# Patient Record
Sex: Female | Born: 1970
Health system: Southern US, Community
[De-identification: ages and names within clinical notes are randomized; demographics above are authoritative.]

## PROBLEM LIST (undated history)

## (undated) ENCOUNTER — Ambulatory Visit: Disposition: A | Discharge status: Home / Self Care | Payer: No Typology Code available for payment source

## (undated) DIAGNOSIS — I10 Essential (primary) hypertension: Secondary | ICD-10-CM

## (undated) DIAGNOSIS — E079 Disorder of thyroid, unspecified: Secondary | ICD-10-CM

## (undated) HISTORY — PX: KNEE ARTHROSCOPY: SUR90

## (undated) HISTORY — PX: MYOMECTOMY: SHX85

## (undated) HISTORY — PX: THYROIDECTOMY: SHX17

## (undated) HISTORY — PX: ENDOMETRIAL ABLATION W/ NOVASURE: SUR434

---

## 2002-08-15 DIAGNOSIS — Z5189 Encounter for other specified aftercare: Secondary | ICD-10-CM

## 2002-08-15 DIAGNOSIS — D649 Anemia, unspecified: Secondary | ICD-10-CM

## 2002-08-15 HISTORY — DX: Anemia, unspecified: D64.9

## 2002-08-15 HISTORY — DX: Encounter for other specified aftercare: Z51.89

## 2016-01-28 DIAGNOSIS — Z860101 Personal history of adenomatous and serrated colon polyps: Secondary | ICD-10-CM | POA: Insufficient documentation

## 2016-01-28 DIAGNOSIS — Z86010 Personal history of colonic polyps: Secondary | ICD-10-CM

## 2016-01-28 HISTORY — DX: Personal history of colonic polyps: Z86.010

## 2017-08-03 ENCOUNTER — Encounter: Payer: Self-pay | Admitting: Family Medicine

## 2017-08-03 DIAGNOSIS — Z86010 Personal history of colonic polyps: Secondary | ICD-10-CM | POA: Diagnosis not present

## 2017-08-03 DIAGNOSIS — Z800 Family history of malignant neoplasm of digestive organs: Secondary | ICD-10-CM | POA: Diagnosis not present

## 2017-08-03 DIAGNOSIS — Z01419 Encounter for gynecological examination (general) (routine) without abnormal findings: Secondary | ICD-10-CM | POA: Diagnosis not present

## 2017-08-03 DIAGNOSIS — Z803 Family history of malignant neoplasm of breast: Secondary | ICD-10-CM | POA: Diagnosis not present

## 2017-08-03 DIAGNOSIS — Z6841 Body Mass Index (BMI) 40.0 and over, adult: Secondary | ICD-10-CM | POA: Diagnosis not present

## 2017-08-14 DIAGNOSIS — J029 Acute pharyngitis, unspecified: Secondary | ICD-10-CM | POA: Diagnosis not present

## 2017-08-14 DIAGNOSIS — R350 Frequency of micturition: Secondary | ICD-10-CM | POA: Diagnosis not present

## 2017-09-18 DIAGNOSIS — Z809 Family history of malignant neoplasm, unspecified: Secondary | ICD-10-CM | POA: Diagnosis not present

## 2017-11-13 ENCOUNTER — Encounter: Payer: Self-pay | Admitting: Family Medicine

## 2017-11-13 DIAGNOSIS — Z9089 Acquired absence of other organs: Secondary | ICD-10-CM | POA: Diagnosis not present

## 2017-11-13 DIAGNOSIS — Z13220 Encounter for screening for lipoid disorders: Secondary | ICD-10-CM | POA: Diagnosis not present

## 2017-11-13 DIAGNOSIS — Z1321 Encounter for screening for nutritional disorder: Secondary | ICD-10-CM | POA: Diagnosis not present

## 2017-11-29 ENCOUNTER — Emergency Department (HOSPITAL_BASED_OUTPATIENT_CLINIC_OR_DEPARTMENT_OTHER): Payer: BLUE CROSS/BLUE SHIELD

## 2017-11-29 ENCOUNTER — Emergency Department (HOSPITAL_BASED_OUTPATIENT_CLINIC_OR_DEPARTMENT_OTHER)
Admission: EM | Admit: 2017-11-29 | Discharge: 2017-11-29 | Disposition: A | Discharge status: Home / Self Care | Payer: BLUE CROSS/BLUE SHIELD | Attending: Emergency Medicine | Admitting: Emergency Medicine

## 2017-11-29 ENCOUNTER — Other Ambulatory Visit: Payer: Self-pay

## 2017-11-29 ENCOUNTER — Encounter (HOSPITAL_BASED_OUTPATIENT_CLINIC_OR_DEPARTMENT_OTHER): Payer: Self-pay | Admitting: *Deleted

## 2017-11-29 DIAGNOSIS — Z79899 Other long term (current) drug therapy: Secondary | ICD-10-CM | POA: Insufficient documentation

## 2017-11-29 DIAGNOSIS — R079 Chest pain, unspecified: Secondary | ICD-10-CM | POA: Insufficient documentation

## 2017-11-29 DIAGNOSIS — I10 Essential (primary) hypertension: Secondary | ICD-10-CM | POA: Insufficient documentation

## 2017-11-29 DIAGNOSIS — E079 Disorder of thyroid, unspecified: Secondary | ICD-10-CM | POA: Insufficient documentation

## 2017-11-29 DIAGNOSIS — R0602 Shortness of breath: Secondary | ICD-10-CM | POA: Diagnosis not present

## 2017-11-29 DIAGNOSIS — R0789 Other chest pain: Secondary | ICD-10-CM | POA: Diagnosis not present

## 2017-11-29 HISTORY — DX: Disorder of thyroid, unspecified: E07.9

## 2017-11-29 HISTORY — DX: Essential (primary) hypertension: I10

## 2017-11-29 LAB — BASIC METABOLIC PANEL
Anion gap: 10 (ref 5–15)
BUN: 14 mg/dL (ref 6–20)
CALCIUM: 9.3 mg/dL (ref 8.9–10.3)
CHLORIDE: 103 mmol/L (ref 101–111)
CO2: 23 mmol/L (ref 22–32)
CREATININE: 0.9 mg/dL (ref 0.44–1.00)
GFR calc non Af Amer: >60 mL/min (ref >60)
Glucose, Bld: 87 mg/dL (ref 65–99)
Potassium: 3.8 mmol/L (ref 3.5–5.1)
SODIUM: 136 mmol/L (ref 135–145)

## 2017-11-29 LAB — CBC WITH DIFFERENTIAL/PLATELET
BASOS ABS: 0 10*3/uL (ref 0.0–0.1)
Basophils Relative: 0 %
EOS ABS: 0.1 10*3/uL (ref 0.0–0.7)
EOS PCT: 2 %
HCT: 33.6 % — ABNORMAL LOW (ref 36.0–46.0)
Hemoglobin: 11 g/dL — ABNORMAL LOW (ref 12.0–15.0)
LYMPHS PCT: 40 %
Lymphs Abs: 2.8 10*3/uL (ref 0.7–4.0)
MCH: 28.5 pg (ref 26.0–34.0)
MCHC: 32.7 g/dL (ref 30.0–36.0)
MCV: 87 fL (ref 78.0–100.0)
Monocytes Absolute: 0.6 10*3/uL (ref 0.1–1.0)
Monocytes Relative: 9 %
Neutro Abs: 3.4 10*3/uL (ref 1.7–7.7)
Neutrophils Relative %: 49 %
PLATELETS: 358 10*3/uL (ref 150–400)
RBC: 3.86 MIL/uL — AB (ref 3.87–5.11)
RDW: 13.2 % (ref 11.5–15.5)
WBC: 6.9 10*3/uL (ref 4.0–10.5)

## 2017-11-29 LAB — TROPONIN I: Troponin I: <0.03 ng/mL (ref <0.03)

## 2017-11-29 LAB — PREGNANCY, URINE: PREG TEST UR: NEGATIVE

## 2017-11-29 NOTE — ED Notes (Signed)
ED Provider at bedside. 

## 2017-11-29 NOTE — ED Triage Notes (Signed)
Pt sent here from UC for cp eval, c/o chest pain and right arm pain x 2 days

## 2017-11-29 NOTE — ED Provider Notes (Signed)
I received signout at the end of [facility], PA-C shift.  Patient here with complaints of chest pain.  Her workup has been unremarkable.  I was asked to follow-up on a delta troponin.  The troponin performed and it was normal.  Patient is resting comfortably.  She is currently stable for discharge.  She will follow-up with her primary care provider for further management.  Return precautions discussed.  She also received cardiology follow-up.  BP 136/68   Pulse 73   Temp (!) 97.4 F (36.3 C) (Oral)   Resp (!) 23   Ht 5\' 5"  (1.651 m)   Wt 113.4 kg (250 lb)   LMP 11/06/2017   SpO2 98%   BMI 41.60 kg/m   Results for orders placed or performed during the hospital encounter of 67/59/16  Basic metabolic panel  Result Value Ref Range   Sodium 136 135 - 145 mmol/L   Potassium 3.8 3.5 - 5.1 mmol/L   Chloride 103 101 - 111 mmol/L   CO2 23 22 - 32 mmol/L   Glucose, Bld 87 65 - 99 mg/dL   BUN 14 6 - 20 mg/dL   Creatinine, Ser 0.90 0.44 - 1.00 mg/dL   Calcium 9.3 8.9 - 10.3 mg/dL   GFR calc non Af Amer >60 >60 mL/min   GFR calc Af Amer >60 >60 mL/min   Anion gap 10 5 - 15  Pregnancy, urine  Result Value Ref Range   Preg Test, Ur NEGATIVE NEGATIVE  CBC with Differential  Result Value Ref Range   WBC 6.9 4.0 - 10.5 K/uL   RBC 3.86 (L) 3.87 - 5.11 MIL/uL   Hemoglobin 11.0 (L) 12.0 - 15.0 g/dL   HCT 33.6 (L) 36.0 - 46.0 %   MCV 87.0 78.0 - 100.0 fL   MCH 28.5 26.0 - 34.0 pg   MCHC 32.7 30.0 - 36.0 g/dL   RDW 13.2 11.5 - 15.5 %   Platelets 358 150 - 400 K/uL   Neutrophils Relative % 49 %   Neutro Abs 3.4 1.7 - 7.7 K/uL   Lymphocytes Relative 40 %   Lymphs Abs 2.8 0.7 - 4.0 K/uL   Monocytes Relative 9 %   Monocytes Absolute 0.6 0.1 - 1.0 K/uL   Eosinophils Relative 2 %   Eosinophils Absolute 0.1 0.0 - 0.7 K/uL   Basophils Relative 0 %   Basophils Absolute 0.0 0.0 - 0.1 K/uL  Troponin I  Result Value Ref Range   Troponin I <0.03 <0.03 ng/mL  Troponin I  Result Value Ref  Range   Troponin I <0.03 <0.03 ng/mL   Dg Chest 2 View  Result Date: 11/29/2017 CLINICAL DATA:  Sternal chest pain starting yesterday. EXAM: CHEST - 2 VIEW COMPARISON:  None. FINDINGS: The heart size and mediastinal contours are within normal limits. Both lungs are clear. The visualized skeletal structures are unremarkable. IMPRESSION: No active cardiopulmonary disease. Electronically Signed   By: Ashley Royalty M.D.   On: 11/29/2017 19:38      Domenic Moras, PA-C 11/29/17 2247    Davonna Belling, MD 11/30/17 [phone]

## 2017-11-29 NOTE — ED Provider Notes (Signed)
[name] EMERGENCY DEPARTMENT Provider Note   CSN: [id number] Arrival date & time: 11/29/17  1813     History   Chief Complaint Chief Complaint  Patient presents with  . Chest Pain    HPI Monica Spencer is a 47 y.o. female with a history of hypertension and hypothyroidism who presents to the emergency department from urgent care for complaint of chest pain that started yesterday afternoon.  Patient states that Monica Spencer was at school walking down the hallway when her right shoulder started to hurt, the pain seemed to radiate to her central chest.  Pain remained in this region.  Describes the discomfort as a pressure that did not have any associated symptoms with onset.  States that by the time Monica Spencer got home Monica Spencer was still having the discomfort, Monica Spencer noticed it got somewhat worse when Monica Spencer went up the stairs, Monica Spencer got anxious, and a bit short of breath but this resolved.  Monica Spencer tried taking a Goody powder without relief.  States Monica Spencer woke up this morning with continued discomfort.  Chest pressure has been constant since it started, it seems to be worse when Monica Spencer takes a big deep breath or when Monica Spencer is walking around, Monica Spencer does not notice it when Monica Spencer is sitting still, however does not feel it is necessary relieved by this.  Rates current discomfort a 3 out of 10 in severity, states this is improved from yesterday, Monica Spencer would not like treatment for this at this time.  Denies nausea, vomiting, lightheadedness, dizziness, syncope, leg pain/swelling, hemoptysis, recent surgery/trauma, recent long travel, hormone use, personal hx of cancer, hx of DVT/PE, or family hx of early CAD. Patient sent from urgent care where Monica Spencer received 324mg  of ASA.    HPI  Past Medical History:  Diagnosis Date  . Hypertension   . Thyroid disease     There are no active problems to display for this patient.   Past Surgical History:  Procedure Laterality Date  . THYROIDECTOMY       OB History   None       Home Medications    Prior to Admission medications   Medication Sig Start Date End Date Taking? Authorizing Provider  atenolol (TENORMIN) 25 MG tablet Take by mouth daily.   Yes [provider]  levothyroxine (SYNTHROID, LEVOTHROID) 100 MCG tablet Take 100 mcg by mouth daily before breakfast.   Yes [provider]    Family History History reviewed. No pertinent family history.  Social History Social History   Tobacco Use  . Smoking status: Never Smoker  . Smokeless tobacco: Never Used  Substance Use Topics  . Alcohol use: Never    Frequency: Never  . Drug use: Never     Allergies   Patient has no known allergies.   Review of Systems Review of Systems  Constitutional: Negative for chills and fever.  Respiratory: Positive for shortness of breath (briefly yesterday, resolved). Negative for cough and wheezing.   Cardiovascular: Positive for chest pain. Negative for palpitations and leg swelling.  Gastrointestinal: Negative for abdominal pain, constipation, diarrhea, nausea and vomiting.  Neurological: Negative for dizziness, syncope and light-headedness.  Psychiatric/Behavioral: The patient is nervous/anxious (briefly yesterday, resolved).   All other systems reviewed and are negative.    Physical Exam Updated Vital Signs BP 138/82 (BP Location: Left Arm)   Pulse 75   Temp (!) 97.4 F (36.3 C) (Oral)   Resp 16   Ht 5\' 5"  (1.651 m)   Wt 113.4  kg (250 lb)   LMP 11/06/2017   SpO2 98%   BMI 41.60 kg/m   Physical Exam  Constitutional: Monica Spencer appears well-developed and well-nourished. No distress.  HENT:  Head: Normocephalic and atraumatic.  Eyes: Conjunctivae are normal. Right eye exhibits no discharge. Left eye exhibits no discharge.  Cardiovascular: Normal rate and regular rhythm.  No murmur heard. Pulses:      Radial pulses are 2+ on the right side, and 2+ on the left side.       Dorsalis pedis pulses are 2+ on the right side, and 2+ on  the left side.  Pulmonary/Chest: Effort normal and breath sounds normal. No respiratory distress. Monica Spencer has no decreased breath sounds. Monica Spencer has no wheezes. Monica Spencer has no rales. Monica Spencer exhibits no tenderness, no crepitus, no edema, no deformity and no swelling.  Abdominal: Soft. Monica Spencer exhibits no distension. There is no tenderness.  Musculoskeletal:       Right lower leg: Monica Spencer exhibits no tenderness and no edema.       Left lower leg: Monica Spencer exhibits no tenderness and no edema.  Neurological: Monica Spencer is alert.  Clear speech.   Skin: Skin is warm and dry. No rash noted.  Psychiatric: Monica Spencer has a normal mood and affect. Her behavior is normal.  Nursing note and vitals reviewed.   ED Treatments / Results  Labs Results for orders placed or performed during the hospital encounter of 54/65/03  Basic metabolic panel  Result Value Ref Range   Sodium 136 135 - 145 mmol/L   Potassium 3.8 3.5 - 5.1 mmol/L   Chloride 103 101 - 111 mmol/L   CO2 23 22 - 32 mmol/L   Glucose, Bld 87 65 - 99 mg/dL   BUN 14 6 - 20 mg/dL   Creatinine, Ser 0.90 0.44 - 1.00 mg/dL   Calcium 9.3 8.9 - 10.3 mg/dL   GFR calc non Af Amer >60 >60 mL/min   GFR calc Af Amer >60 >60 mL/min   Anion gap 10 5 - 15  Pregnancy, urine  Result Value Ref Range   Preg Test, Ur NEGATIVE NEGATIVE  CBC with Differential  Result Value Ref Range   WBC 6.9 4.0 - 10.5 K/uL   RBC 3.86 (L) 3.87 - 5.11 MIL/uL   Hemoglobin 11.0 (L) 12.0 - 15.0 g/dL   HCT 33.6 (L) 36.0 - 46.0 %   MCV 87.0 78.0 - 100.0 fL   MCH 28.5 26.0 - 34.0 pg   MCHC 32.7 30.0 - 36.0 g/dL   RDW 13.2 11.5 - 15.5 %   Platelets 358 150 - 400 K/uL   Neutrophils Relative % 49 %   Neutro Abs 3.4 1.7 - 7.7 K/uL   Lymphocytes Relative 40 %   Lymphs Abs 2.8 0.7 - 4.0 K/uL   Monocytes Relative 9 %   Monocytes Absolute 0.6 0.1 - 1.0 K/uL   Eosinophils Relative 2 %   Eosinophils Absolute 0.1 0.0 - 0.7 K/uL   Basophils Relative 0 %   Basophils Absolute 0.0 0.0 - 0.1 K/uL  Troponin I  Result  Value Ref Range   Troponin I <0.03 <0.03 ng/mL    EKG EKG Interpretation  Date/Time:  Wednesday November 29 2017 18:28:13 EDT Ventricular Rate:  74 PR Interval:    QRS Duration: 90 QT Interval:  410 QTC Calculation: 455 R Axis:   39 Text Interpretation:  Sinus rhythm Confirmed by Davonna Belling [phone]) on 11/29/2017 6:36:42 PM   Radiology Dg Chest 2 View  Result  Date: 11/29/2017 CLINICAL DATA:  Sternal chest pain starting yesterday. EXAM: CHEST - 2 VIEW COMPARISON:  None. FINDINGS: The heart size and mediastinal contours are within normal limits. Both lungs are clear. The visualized skeletal structures are unremarkable. IMPRESSION: No active cardiopulmonary disease. Electronically Signed   By: Ashley Royalty M.D.   On: 11/29/2017 19:38    Procedures Procedures (including critical care time)  Medications Ordered in ED Medications - No data to display   Initial Impression / Assessment and Plan / ED Course  I have reviewed the triage vital signs and the nursing notes.  Pertinent labs & imaging results that were available during my care of the patient were reviewed by me and considered in my medical decision making (see chart for details).   Patient presents with complaint of chest pain. Monica Spencer is nontoxic appearing, in no apparent distress, vitals without significant abnormality. Patient has benign physical exam. Will initiate chest pain work-up with labs, EKG, and CXR.   Labs reviewed and grossly unremarkable. Patient with hgb of 11.0- this is improved from last on record with chart review of 10.0 in 2011. No leucocytosis. No significant electrolyte abnormalities. Troponin WNL. CXR negative for acute abnormality. Patient is PERC negative therefore doubt pulmonary embolism. Her pain is not a tearing sensation, there is no widening of the mediastinum on CXR, radial pulses are 2+ and symmetric therefore doubt dissection. Patient with Heart Path Score of 3- given middle pressure/heaviness  that is somewhat worsened with exertion, cardiac risk factors include hypertensive and obesity, and EKG shows NSR without obvious ischemia. With presentation and Heart Path score of 3 will plan for delta troponin- if negative plan for discharge home with cardiology follow up. Findings and plan of care discussed with supervising physician Dr. Alvino Chapel who is in agreement with plan. All results thus far have been discussed with the patient who is resting comfortably on my re-evaluation.    Patient signed out at change of shift to Domenic Moras PA-C pending delta troponin- if negative plan for DC home with cardiology follow up.   Final Clinical Impressions(s) / ED Diagnoses   Final diagnoses:  Chest pain, unspecified type    ED Discharge Orders    None       Leafy Kindle 11/29/17 2107    Davonna Belling, MD 11/30/17 [phone]

## 2017-11-29 NOTE — Discharge Instructions (Addendum)
You were seen in the emergency department today for chest pain. Your EKG was reassuring. The enzyme we use to check your heart was normal. Your labs did not show any problems with your electrolytes or kidneys. Your hemoglobin was a bit low at 11.0- this is similar to previous blood work you have had done. Your chest x-ray was normal.   We would like you to follow up with a cardiologist in the next 3-5 days for re-evaluation of your symptoms and possible further work-up. With this being said please return to the ER at any time for any new or worsening symptoms including but not limited to worsening pain, shortness of breath, nausea, vomiting, passing out, or any other concerns you may have.

## 2017-12-04 ENCOUNTER — Ambulatory Visit: Payer: BLUE CROSS/BLUE SHIELD | Admitting: Family Medicine

## 2017-12-04 ENCOUNTER — Encounter: Payer: Self-pay | Admitting: Family Medicine

## 2017-12-04 VITALS — BP 128/80 | HR 69 | Temp 98.3°F | Ht 65.5 in | Wt 252.9 lb

## 2017-12-04 DIAGNOSIS — Z131 Encounter for screening for diabetes mellitus: Secondary | ICD-10-CM | POA: Diagnosis not present

## 2017-12-04 DIAGNOSIS — I10 Essential (primary) hypertension: Secondary | ICD-10-CM | POA: Diagnosis not present

## 2017-12-04 DIAGNOSIS — E038 Other specified hypothyroidism: Secondary | ICD-10-CM

## 2017-12-04 DIAGNOSIS — Z13220 Encounter for screening for lipoid disorders: Secondary | ICD-10-CM

## 2017-12-04 DIAGNOSIS — Z0000 Encounter for general adult medical examination without abnormal findings: Secondary | ICD-10-CM

## 2017-12-04 NOTE — Progress Notes (Signed)
Patient presents to clinic today for CPE and to establish care.  SUBJECTIVE: PMH: Pt is a 47 yo female with pmh sig for HTN, hypothyroidism, h/o polyps in colon.  Pt was previously seen in [place], [place].  Pt has a goal to loose 40-50 lbs before her 48th birthday next yr.  HTN: -pt taking atenolol 50 mg daily -not currently checking bp at home -endorses not eating as well as she normally does.  Pt has been living between here and [place].  Endorses cooking less. -pt drinking water daily  Hypothyroidism: -pt has a h/o goiter s/p partial resection of thyroid -currently taking levothyroxine 100 mcg daily -denies hair loss, palpitations, heat or cold intolerance, constipation or diarrhea  Allergies: NKDA -pt does mention morphine after surgery makes her itch    Past surgical history: Myomectomy 2004 and 2009 for history of fibroids which caused anemia Partial thyroidectomy 1999 Breast biopsy, left 2009 Removal of extra bone and left knee 1989 Uterine artery embolization 2017  Social history: Patient is married.  She has an adopted daughter.  She currently works as a Programmer, multimedia.  She has a Oceanographer in [employer].  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  Patient denies alcohol, tobacco, drug use.  Family medical history: Mom-Deceased, colon cancer, diabetes, HTN Dad-deceased, colon cancer Sister-Diedre, alive Brother-Wayne, alive, colon cancer [employer]-deceased, breast cancer, DM   Health Maintenance: Vision --Dr. Myrene Buddy Immunizations --influenza vaccine 2017 Colonoscopy --2017.  Due every 3 years given family history and polyps Mammogram --2018 PAP --December 2018 LMP--12/03/17  Past Medical History:  Diagnosis Date  . Hypertension   . Thyroid disease     Past Surgical History:  Procedure Laterality Date  . THYROIDECTOMY      Current Outpatient Medications on File Prior to Visit  Medication Sig Dispense Refill  . atenolol (TENORMIN) 25  MG tablet Take by mouth daily.    Marland Kitchen levothyroxine (SYNTHROID, LEVOTHROID) 100 MCG tablet Take 100 mcg by mouth daily before breakfast.     No current facility-administered medications on file prior to visit.     No Known Allergies  History reviewed. No pertinent family history.  Social History   Socioeconomic History  . Marital status: Married    Spouse name: Not on file  . Number of children: Not on file  . Years of education: Not on file  . Highest education level: Not on file  Occupational History  . Not on file  Social Needs  . Financial resource strain: Not on file  . Food insecurity:    Worry: Not on file    Inability: Not on file  . Transportation needs:    Medical: Not on file    Non-medical: Not on file  Tobacco Use  . Smoking status: Never Smoker  . Smokeless tobacco: Never Used  Substance and Sexual Activity  . Alcohol use: Never    Frequency: Never  . Drug use: Never  . Sexual activity: Not on file  Lifestyle  . Physical activity:    Days per week: Not on file    Minutes per session: Not on file  . Stress: Not on file  Relationships  . Social connections:    Talks on phone: Not on file    Gets together: Not on file    Attends religious service: Not on file    Active member of club or organization: Not on file    Attends meetings of clubs or organizations: Not on file  Relationship status: Not on file  . Intimate partner violence:    Fear of current or ex partner: Not on file    Emotionally abused: Not on file    Physically abused: Not on file    Forced sexual activity: Not on file  Other Topics Concern  . Not on file  Social History Narrative  . Not on file    ROS General: Denies fever, chills, night sweats, changes in weight, changes in appetite HEENT: Denies headaches, ear pain, changes in vision, rhinorrhea, sore throat CV: Denies CP, palpitations, SOB, orthopnea Pulm: Denies SOB, cough, wheezing GI: Denies abdominal pain, nausea,  vomiting, diarrhea, constipation GU: Denies dysuria, hematuria, frequency, vaginal discharge Msk: Denies muscle cramps, joint pains Neuro: Denies weakness, numbness, tingling Skin: Denies rashes, bruising Psych: Denies depression, anxiety, hallucinations  BP 128/80 (BP Location: Left Arm, Patient Position: Sitting, Cuff Size: Large)   Pulse 69   Temp 98.3 F (36.8 C) (Oral)   Ht 5' 5.5" (1.664 m)   Wt 252 lb 14.4 oz (114.7 kg)   LMP 11/06/2017   SpO2 98%   BMI 41.44 kg/m   Physical Exam  Gen. Pleasant, well developed, well-nourished, in NAD HEENT - [place]/AT, PERRL, no scleral icterus, no nasal drainage, pharynx without erythema or exudate.  TMs normal bilaterally.  No cervical lymphadenopathy. Neck: No JVD, no thyromegaly, no carotid bruits Lungs: no use of accessory muscles, CTAB, no wheezes, rales or rhonchi Cardiovascular: RRR, No r/g/m, no peripheral edema Abdomen: BS present, soft, nontender, nondistended, no hepatosplenomegaly Musculoskeletal: No deformities, moves all four extremities, no cyanosis or clubbing, normal tone Neuro:  A&Ox3, CN II-XII intact, normal gait Skin:  Warm, dry, intact, no lesions Psych: normal affect, mood appropriate  Recent Results (from the past 2160 hour(s))  Basic metabolic panel     Status: None   Collection Time: 11/29/17  6:48 PM  Result Value Ref Range   Sodium 136 135 - 145 mmol/L   Potassium 3.8 3.5 - 5.1 mmol/L   Chloride 103 101 - 111 mmol/L   CO2 23 22 - 32 mmol/L   Glucose, Bld 87 65 - 99 mg/dL   BUN 14 6 - 20 mg/dL   Creatinine, Ser 0.90 0.44 - 1.00 mg/dL   Calcium 9.3 8.9 - 10.3 mg/dL   GFR calc non Af Amer >60 >60 mL/min   GFR calc Af Amer >60 >60 mL/min    Comment: (NOTE) The eGFR has been calculated using the CKD EPI equation. This calculation has not been validated in all clinical situations. eGFR's persistently <60 mL/min signify possible Chronic Kidney Disease.    Anion gap 10 5 - 15    Comment: Performed at [facility], [address] [place], [place] [address]  Pregnancy, urine     Status: None   Collection Time: 11/29/17  6:48 PM  Result Value Ref Range   Preg Test, Ur NEGATIVE NEGATIVE    Comment:        THE SENSITIVITY OF THIS METHODOLOGY IS >20 mIU/mL. Performed at [facility], [address] [place], [place] [address]   CBC with Differential     Status: Abnormal   Collection Time: 11/29/17  6:48 PM  Result Value Ref Range   WBC 6.9 4.0 - 10.5 K/uL   RBC 3.86 (L) 3.87 - 5.11 MIL/uL   Hemoglobin 11.0 (L) 12.0 - 15.0 g/dL   HCT 33.6 (L) 36.0 - 46.0 %   MCV 87.0 78.0 -  100.0 fL   MCH 28.5 26.0 - 34.0 pg   MCHC 32.7 30.0 - 36.0 g/dL   RDW 13.2 11.5 - 15.5 %   Platelets 358 150 - 400 K/uL   Neutrophils Relative % 49 %   Neutro Abs 3.4 1.7 - 7.7 K/uL   Lymphocytes Relative 40 %   Lymphs Abs 2.8 0.7 - 4.0 K/uL   Monocytes Relative 9 %   Monocytes Absolute 0.6 0.1 - 1.0 K/uL   Eosinophils Relative 2 %   Eosinophils Absolute 0.1 0.0 - 0.7 K/uL   Basophils Relative 0 %   Basophils Absolute 0.0 0.0 - 0.1 K/uL    Comment: Performed at [facility], [address] [place], [place] [address]  Troponin I     Status: None   Collection Time: 11/29/17  6:48 PM  Result Value Ref Range   Troponin I <0.03 <0.03 ng/mL    Comment: Performed at [facility], [address] [place], [place] [address]  Troponin I     Status: None   Collection Time: 11/29/17  9:36 PM  Result Value Ref Range   Troponin I <0.03 <0.03 ng/mL    Comment: Performed at [facility], [address] [place], [place] [address]    Assessment/Plan: Well adult exam  -Anticipatory guidance given including wearing seatbelts, smoke detectors in the home, increasing physical activity, increasing p.o. intake of water and vegetables. -Pap up-to-date, done by OB/GYN -Mammogram up-to-date -Next CPE in 1 year - Plan: CBC with Differential/Platelet  Essential  hypertension -Continue atenolol 50 mg daily  - Plan: Basic metabolic panel  Other specified hypothyroidism -s/p partial thyroidectomy -Continue levothyroxine 100 mcg daily - Plan: TSH  Screening for diabetes mellitus  - Plan: Hemoglobin A1c  Screening for cholesterol level  - Plan: Lipid panel  F/u prn  Grier Mitts, MD

## 2017-12-04 NOTE — Patient Instructions (Addendum)
Preventive Care 40-64 Years, Female Preventive care refers to lifestyle choices and visits with your health care provider that can promote health and wellness. What does preventive care include?  A yearly physical exam. This is also called an annual well check.  Dental exams once or twice a year.  Routine eye exams. Ask your health care provider how often you should have your eyes checked.  Personal lifestyle choices, including: ? Daily care of your teeth and gums. ? Regular physical activity. ? Eating a healthy diet. ? Avoiding tobacco and drug use. ? Limiting alcohol use. ? Practicing safe sex. ? Taking low-dose aspirin daily starting at age 50. ? Taking vitamin and mineral supplements as recommended by your health care provider. What happens during an annual well check? The services and screenings done by your health care provider during your annual well check will depend on your age, overall health, lifestyle risk factors, and family history of disease. Counseling Your health care provider may ask you questions about your:  Alcohol use.  Tobacco use.  Drug use.  Emotional well-being.  Home and relationship well-being.  Sexual activity.  Eating habits.  Work and work environment.  Method of birth control.  Menstrual cycle.  Pregnancy history.  Screening You may have the following tests or measurements:  Height, weight, and BMI.  Blood pressure.  Lipid and cholesterol levels. These may be checked every 5 years, or more frequently if you are over 50 years old.  Skin check.  Lung cancer screening. You may have this screening every year starting at age 55 if you have a 30-pack-year history of smoking and currently smoke or have quit within the past 15 years.  Fecal occult blood test (FOBT) of the stool. You may have this test every year starting at age 50.  Flexible sigmoidoscopy or colonoscopy. You may have a sigmoidoscopy every 5 years or a colonoscopy  every 10 years starting at age 50.  Hepatitis C blood test.  Hepatitis B blood test.  Sexually transmitted disease (STD) testing.  Diabetes screening. This is done by checking your blood sugar (glucose) after you have not eaten for a while (fasting). You may have this done every 1-3 years.  Mammogram. This may be done every 1-2 years. Talk to your health care provider about when you should start having regular mammograms. This may depend on whether you have a family history of breast cancer.  BRCA-related cancer screening. This may be done if you have a family history of breast, ovarian, tubal, or peritoneal cancers.  Pelvic exam and Pap test. This may be done every 3 years starting at age 21. Starting at age 30, this may be done every 5 years if you have a Pap test in combination with an HPV test.  Bone density scan. This is done to screen for osteoporosis. You may have this scan if you are at high risk for osteoporosis.  Discuss your test results, treatment options, and if necessary, the need for more tests with your health care provider. Vaccines Your health care provider may recommend certain vaccines, such as:  Influenza vaccine. This is recommended every year.  Tetanus, diphtheria, and acellular pertussis (Tdap, Td) vaccine. You may need a Td booster every 10 years.  Varicella vaccine. You may need this if you have not been vaccinated.  Zoster vaccine. You may need this after age 60.  Measles, mumps, and rubella (MMR) vaccine. You may need at least one dose of MMR if you were born in   in 1957 or later. You may also need a second dose.  Pneumococcal 13-valent conjugate (PCV13) vaccine. You may need this if you have certain conditions and were not previously vaccinated.  Pneumococcal polysaccharide (PPSV23) vaccine. You may need one or two doses if you smoke cigarettes or if you have certain conditions.  Meningococcal vaccine. You may need this if you have certain  conditions.  Hepatitis A vaccine. You may need this if you have certain conditions or if you travel or work in places where you may be exposed to hepatitis A.  Hepatitis B vaccine. You may need this if you have certain conditions or if you travel or work in places where you may be exposed to hepatitis B.  Haemophilus influenzae type b (Hib) vaccine. You may need this if you have certain conditions.  Talk to your health care provider about which screenings and vaccines you need and how often you need them. This information is not intended to replace advice given to you by your health care provider. Make sure you discuss any questions you have with your health care provider. Document Released: 08/28/2015 Document Revised: 04/20/2016 Document Reviewed: 06/02/2015 Elsevier Interactive Patient Education  2018 [place] Eating Plan DASH stands for "Dietary Approaches to Stop Hypertension." The DASH eating plan is a healthy eating plan that has been shown to reduce high blood pressure (hypertension). It may also reduce your risk for type 2 diabetes, heart disease, and stroke. The DASH eating plan may also help with weight loss. What are tips for following this plan? General guidelines  Avoid eating more than 2,300 mg (milligrams) of salt (sodium) a day. If you have hypertension, you may need to reduce your sodium intake to 1,500 mg a day.  Limit alcohol intake to no more than 1 drink a day for nonpregnant women and 2 drinks a day for men. One drink equals 12 oz of beer, 5 oz of wine, or 1 oz of hard liquor.  Work with your health care provider to maintain a healthy body weight or to lose weight. Ask what an ideal weight is for you.  Get at least 30 minutes of exercise that causes your heart to beat faster (aerobic exercise) most days of the week. Activities may include walking, swimming, or biking.  Work with your health care provider or diet and nutrition specialist (dietitian) to  adjust your eating plan to your individual calorie needs. Reading food labels  Check food labels for the amount of sodium per serving. Choose foods with less than 5 percent of the Daily Value of sodium. Generally, foods with less than 300 mg of sodium per serving fit into this eating plan.  To find whole grains, look for the word "whole" as the first word in the ingredient list. Shopping  Buy products labeled as "low-sodium" or "no salt added."  Buy fresh foods. Avoid canned foods and premade or frozen meals. Cooking  Avoid adding salt when cooking. Use salt-free seasonings or herbs instead of table salt or sea salt. Check with your health care provider or pharmacist before using salt substitutes.  Do not fry foods. Cook foods using healthy methods such as baking, boiling, grilling, and broiling instead.  Cook with heart-healthy oils, such as olive, canola, soybean, or sunflower oil. Meal planning   Eat a balanced diet that includes: ? 5 or more servings of fruits and vegetables each day. At each meal, try to fill half of your plate with fruits and vegetables. ? Up  to 6-8 servings of whole grains each day. ? Less than 6 oz of lean meat, poultry, or fish each day. A 3-oz serving of meat is about the same size as a deck of cards. One egg equals 1 oz. ? 2 servings of low-fat dairy each day. ? A serving of nuts, seeds, or beans 5 times each week. ? Heart-healthy fats. Healthy fats called Omega-3 fatty acids are found in foods such as flaxseeds and coldwater fish, like sardines, salmon, and mackerel.  Limit how much you eat of the following: ? Canned or prepackaged foods. ? Food that is high in trans fat, such as fried foods. ? Food that is high in saturated fat, such as fatty meat. ? Sweets, desserts, sugary drinks, and other foods with added sugar. ? Full-fat dairy products.  Do not salt foods before eating.  Try to eat at least 2 vegetarian meals each week.  Eat more home-cooked  food and less restaurant, buffet, and fast food.  When eating at a restaurant, ask that your food be prepared with less salt or no salt, if possible. What foods are recommended? The items listed may not be a complete list. Talk with your dietitian about what dietary choices are best for you. Grains Whole-grain or whole-wheat bread. Whole-grain or whole-wheat pasta. Brown rice. [place]. Bulgur. Whole-grain and low-sodium cereals. Pita bread. Low-fat, low-sodium crackers. Whole-wheat flour tortillas. Vegetables Fresh or frozen vegetables (raw, steamed, roasted, or grilled). Low-sodium or reduced-sodium tomato and vegetable juice. Low-sodium or reduced-sodium tomato sauce and tomato paste. Low-sodium or reduced-sodium canned vegetables. Fruits All fresh, dried, or frozen fruit. Canned fruit in natural juice (without added sugar). Meat and other protein foods Skinless chicken or [place]. Ground chicken or [place]. Pork with fat trimmed off. Fish and seafood. Egg whites. Dried beans, peas, or lentils. Unsalted nuts, nut butters, and seeds. Unsalted canned beans. Lean cuts of beef with fat trimmed off. Low-sodium, lean deli meat. Dairy Low-fat (1%) or fat-free (skim) milk. Fat-free, low-fat, or reduced-fat cheeses. Nonfat, low-sodium ricotta or cottage cheese. Low-fat or nonfat yogurt. Low-fat, low-sodium cheese. Fats and oils Soft margarine without trans fats. Vegetable oil. Low-fat, reduced-fat, or light mayonnaise and salad dressings (reduced-sodium). Canola, safflower, olive, soybean, and sunflower oils. Avocado. Seasoning and other foods Herbs. Spices. Seasoning mixes without salt. Unsalted popcorn and pretzels. Fat-free sweets. What foods are not recommended? The items listed may not be a complete list. Talk with your dietitian about what dietary choices are best for you. Grains Baked goods made with fat, such as croissants, muffins, or some breads. Dry pasta or rice meal  packs. Vegetables Creamed or fried vegetables. Vegetables in a cheese sauce. Regular canned vegetables (not low-sodium or reduced-sodium). Regular canned tomato sauce and paste (not low-sodium or reduced-sodium). Regular tomato and vegetable juice (not low-sodium or reduced-sodium). Angie Fava. Olives. Fruits Canned fruit in a light or heavy syrup. Fried fruit. Fruit in cream or butter sauce. Meat and other protein foods Fatty cuts of meat. Ribs. Fried meat. Berniece Salines. Sausage. Bologna and other processed lunch meats. Salami. Fatback. Hotdogs. Bratwurst. Salted nuts and seeds. Canned beans with added salt. Canned or smoked fish. Whole eggs or egg yolks. Chicken or [place] with skin. Dairy Whole or 2% milk, cream, and half-and-half. Whole or full-fat cream cheese. Whole-fat or sweetened yogurt. Full-fat cheese. Nondairy creamers. Whipped toppings. Processed cheese and cheese spreads. Fats and oils Butter. Stick margarine. Lard. Shortening. Ghee. Bacon fat. Tropical oils, such as coconut, palm kernel, or palm oil. Seasoning  and other foods Salted popcorn and pretzels. Onion salt, garlic salt, seasoned salt, table salt, and sea salt. Worcestershire sauce. Tartar sauce. Barbecue sauce. Teriyaki sauce. Soy sauce, including reduced-sodium. Steak sauce. Canned and packaged gravies. Fish sauce. Oyster sauce. Cocktail sauce. Horseradish that you find on the shelf. Ketchup. Mustard. Meat flavorings and tenderizers. Bouillon cubes. Hot sauce and Tabasco sauce. Premade or packaged marinades. Premade or packaged taco seasonings. Relishes. Regular salad dressings. Where to find more information:  National Heart, Lung, and [place]: https://wilson-eaton.com/  American Heart Association: www.heart.org Summary  The DASH eating plan is a healthy eating plan that has been shown to reduce high blood pressure (hypertension). It may also reduce your risk for type 2 diabetes, heart disease, and stroke.  With the DASH eating  plan, you should limit salt (sodium) intake to 2,300 mg a day. If you have hypertension, you may need to reduce your sodium intake to 1,500 mg a day.  When on the DASH eating plan, aim to eat more fresh fruits and vegetables, whole grains, lean proteins, low-fat dairy, and heart-healthy fats.  Work with your health care provider or diet and nutrition specialist (dietitian) to adjust your eating plan to your individual calorie needs. This information is not intended to replace advice given to you by your health care provider. Make sure you discuss any questions you have with your health care provider. Document Released: 07/21/2011 Document Revised: 07/25/2016 Document Reviewed: 07/25/2016 Elsevier Interactive Patient Education  2018 [employer].  Exercising to [employer] Exercising can help you to lose weight. In order to lose weight through exercise, you need to do vigorous-intensity exercise. You can tell that you are exercising with vigorous intensity if you are breathing very hard and fast and cannot hold a conversation while exercising. Moderate-intensity exercise helps to maintain your current weight. You can tell that you are exercising at a moderate level if you have a higher heart rate and faster breathing, but you are still able to hold a conversation. How often should I exercise? Choose an activity that you enjoy and set realistic goals. Your health care provider can help you to make an activity plan that works for you. Exercise regularly as directed by your health care provider. This may include:  Doing resistance training twice each week, such as: ? Push-ups. ? Sit-ups. ? Lifting weights. ? Using resistance bands.  Doing a given intensity of exercise for a given amount of time. Choose from these options: ? 150 minutes of moderate-intensity exercise every week. ? 75 minutes of vigorous-intensity exercise every week. ? A mix of moderate-intensity and vigorous-intensity exercise  every week.  Children, pregnant women, people who are out of shape, people who are overweight, and older adults may need to consult a health care provider for individual recommendations. If you have any sort of medical condition, be sure to consult your health care provider before starting a new exercise program. What are some activities that can help me to lose weight?  Walking at a rate of at least 4.5 miles an hour.  Jogging or running at a rate of 5 miles per hour.  Biking at a rate of at least 10 miles per hour.  Lap swimming.  Roller-skating or in-line skating.  Cross-country skiing.  Vigorous competitive sports, such as football, basketball, and soccer.  Jumping rope.  Aerobic dancing. How can I be more active in my day-to-day activities?  Use the stairs instead of the elevator.  Take a walk during your lunch  break.  If you drive, park your car farther away from work or school.  If you take public transportation, get off one stop early and walk the rest of the way.  Make all of your phone calls while standing up and walking around.  Get up, stretch, and walk around every 30 minutes throughout the day. What guidelines should I follow while exercising?  Do not exercise so much that you hurt yourself, feel dizzy, or get very short of breath.  Consult your health care provider prior to starting a new exercise program.  Wear comfortable clothes and shoes with good support.  Drink plenty of water while you exercise to prevent dehydration or heat stroke. Body water is lost during exercise and must be replaced.  Work out until you breathe faster and your heart beats faster. This information is not intended to replace advice given to you by your health care provider. Make sure you discuss any questions you have with your health care provider. Document Released: 09/03/2010 Document Revised: 01/07/2016 Document Reviewed: 01/02/2014 Elsevier Interactive Patient Education   [employer].

## 2017-12-13 ENCOUNTER — Other Ambulatory Visit (INDEPENDENT_AMBULATORY_CARE_PROVIDER_SITE_OTHER): Payer: BLUE CROSS/BLUE SHIELD

## 2017-12-13 DIAGNOSIS — Z0000 Encounter for general adult medical examination without abnormal findings: Secondary | ICD-10-CM | POA: Diagnosis not present

## 2017-12-13 DIAGNOSIS — E038 Other specified hypothyroidism: Secondary | ICD-10-CM

## 2017-12-13 DIAGNOSIS — Z131 Encounter for screening for diabetes mellitus: Secondary | ICD-10-CM | POA: Diagnosis not present

## 2017-12-13 DIAGNOSIS — I10 Essential (primary) hypertension: Secondary | ICD-10-CM | POA: Diagnosis not present

## 2017-12-13 DIAGNOSIS — Z13220 Encounter for screening for lipoid disorders: Secondary | ICD-10-CM | POA: Diagnosis not present

## 2017-12-13 LAB — CBC WITH DIFFERENTIAL/PLATELET
BASOS ABS: 0 10*3/uL (ref 0.0–0.1)
Basophils Relative: 0.7 % (ref 0.0–3.0)
Eosinophils Absolute: 0.1 10*3/uL (ref 0.0–0.7)
Eosinophils Relative: 1.6 % (ref 0.0–5.0)
HEMATOCRIT: 34.7 % — AB (ref 36.0–46.0)
HEMOGLOBIN: 11.3 g/dL — AB (ref 12.0–15.0)
LYMPHS PCT: 39.8 % (ref 12.0–46.0)
Lymphs Abs: 1.9 10*3/uL (ref 0.7–4.0)
MCHC: 32.7 g/dL (ref 30.0–36.0)
MCV: 86.4 fl (ref 78.0–100.0)
MONOS PCT: 8.6 % (ref 3.0–12.0)
Monocytes Absolute: 0.4 10*3/uL (ref 0.1–1.0)
NEUTROS ABS: 2.4 10*3/uL (ref 1.4–7.7)
Neutrophils Relative %: 49.3 % (ref 43.0–77.0)
PLATELETS: 369 10*3/uL (ref 150.0–400.0)
RBC: 4.01 Mil/uL (ref 3.87–5.11)
RDW: 13.8 % (ref 11.5–15.5)
WBC: 4.8 10*3/uL (ref 4.0–10.5)

## 2017-12-13 LAB — LIPID PANEL
CHOLESTEROL: 214 mg/dL — AB (ref 0–200)
HDL: 51.7 mg/dL (ref >39.00)
LDL CALC: 142 mg/dL — AB (ref 0–99)
NonHDL: 162.43
Total CHOL/HDL Ratio: 4
Triglycerides: 101 mg/dL (ref 0.0–149.0)
VLDL: 20.2 mg/dL (ref 0.0–40.0)

## 2017-12-13 LAB — TSH: TSH: 0.83 u[IU]/mL (ref 0.35–4.50)

## 2017-12-13 LAB — BASIC METABOLIC PANEL
BUN: 14 mg/dL (ref 6–23)
CALCIUM: 9 mg/dL (ref 8.4–10.5)
CHLORIDE: 103 meq/L (ref 96–112)
CO2: 28 meq/L (ref 19–32)
Creatinine, Ser: 1.03 mg/dL (ref 0.40–1.20)
GFR: 73.16 mL/min (ref >60.00)
Glucose, Bld: 85 mg/dL (ref 70–99)
Potassium: 4.3 mEq/L (ref 3.5–5.1)
SODIUM: 139 meq/L (ref 135–145)

## 2017-12-13 LAB — HEMOGLOBIN A1C: HEMOGLOBIN A1C: 6.1 % (ref 4.6–6.5)

## 2017-12-15 ENCOUNTER — Telehealth: Payer: Self-pay | Admitting: Family Medicine

## 2017-12-15 DIAGNOSIS — E785 Hyperlipidemia, unspecified: Secondary | ICD-10-CM

## 2017-12-15 MED ORDER — ATENOLOL 25 MG PO TABS: 25.0000 mg | ORAL_TABLET | Freq: Every day | ORAL | 1 refills | Status: DC

## 2017-12-15 MED ORDER — LEVOTHYROXINE SODIUM 100 MCG PO TABS: 100.0000 ug | ORAL_TABLET | Freq: Every day | ORAL | 1 refills | Status: DC

## 2017-12-15 NOTE — Progress Notes (Signed)
Lab orders entered

## 2017-12-15 NOTE — Telephone Encounter (Signed)
Medication filled to pharmacy as requested.   

## 2017-12-15 NOTE — Addendum Note (Signed)
Addended by: Dorrene German on: 12/15/2017 02:46 PM   Modules accepted: Orders

## 2017-12-15 NOTE — Telephone Encounter (Signed)
Please advise, okay to refill? Previously filled by historical provider.

## 2017-12-15 NOTE — Telephone Encounter (Signed)
LOV  12/04/17 Dr. Volanda Napoleon Pt. Requesting Synthroid and Atenolol be refilled to [facility]  Mail order.

## 2017-12-15 NOTE — Telephone Encounter (Signed)
That is fine 

## 2018-02-11 DIAGNOSIS — S239XXA Sprain of unspecified parts of thorax, initial encounter: Secondary | ICD-10-CM | POA: Diagnosis not present

## 2018-06-18 ENCOUNTER — Other Ambulatory Visit (INDEPENDENT_AMBULATORY_CARE_PROVIDER_SITE_OTHER): Payer: BLUE CROSS/BLUE SHIELD

## 2018-06-18 DIAGNOSIS — Z131 Encounter for screening for diabetes mellitus: Secondary | ICD-10-CM

## 2018-06-18 DIAGNOSIS — E785 Hyperlipidemia, unspecified: Secondary | ICD-10-CM | POA: Diagnosis not present

## 2018-06-18 LAB — LIPID PANEL
CHOLESTEROL: 213 mg/dL — AB (ref 0–200)
HDL: 46.2 mg/dL (ref >39.00)
LDL Cholesterol: 142 mg/dL — ABNORMAL HIGH (ref 0–99)
NONHDL: 166.35
Total CHOL/HDL Ratio: 5
Triglycerides: 124 mg/dL (ref 0.0–149.0)
VLDL: 24.8 mg/dL (ref 0.0–40.0)

## 2018-06-18 LAB — HEMOGLOBIN A1C: Hgb A1c MFr Bld: 6.1 % (ref 4.6–6.5)

## 2018-10-14 ENCOUNTER — Telehealth: Payer: Self-pay | Admitting: Family Medicine

## 2018-10-24 ENCOUNTER — Other Ambulatory Visit: Payer: Self-pay

## 2018-10-24 MED ORDER — LEVOTHYROXINE SODIUM 100 MCG PO TABS: 100.0000 ug | ORAL_TABLET | Freq: Every day | ORAL | 0 refills | Status: DC

## 2018-10-24 NOTE — Telephone Encounter (Signed)
Patient called to inform the doctor that she is all out of her medication, levothyroxine (SYNTHROID, LEVOTHROID) 100 MCG tablet, for a week now.  The pharmacy refused it because they said she needed an appointment.  Patient does have an appt. Scheduled for 12/12/18.  Patient would like enough pills until the appointment.  Please advise and call back at [phone]

## 2018-10-24 NOTE — Telephone Encounter (Signed)
Rx refilled.

## 2018-10-25 ENCOUNTER — Encounter: Payer: BLUE CROSS/BLUE SHIELD | Admitting: Family Medicine

## 2018-11-22 ENCOUNTER — Other Ambulatory Visit: Payer: Self-pay | Admitting: Family Medicine

## 2018-12-12 ENCOUNTER — Other Ambulatory Visit: Payer: Self-pay

## 2018-12-12 ENCOUNTER — Ambulatory Visit (INDEPENDENT_AMBULATORY_CARE_PROVIDER_SITE_OTHER): Payer: BLUE CROSS/BLUE SHIELD | Admitting: Family Medicine

## 2018-12-12 ENCOUNTER — Encounter: Payer: Self-pay | Admitting: Family Medicine

## 2018-12-12 DIAGNOSIS — E890 Postprocedural hypothyroidism: Secondary | ICD-10-CM | POA: Diagnosis not present

## 2018-12-12 DIAGNOSIS — K635 Polyp of colon: Secondary | ICD-10-CM

## 2018-12-12 DIAGNOSIS — I10 Essential (primary) hypertension: Secondary | ICD-10-CM | POA: Diagnosis not present

## 2018-12-12 MED ORDER — ATENOLOL 25 MG PO TABS: 25.0000 mg | ORAL_TABLET | Freq: Every day | ORAL | 3 refills | Status: DC

## 2018-12-12 MED ORDER — LEVOTHYROXINE SODIUM 100 MCG PO TABS: 100.0000 ug | ORAL_TABLET | Freq: Every day | ORAL | 3 refills | Status: DC

## 2018-12-12 NOTE — Progress Notes (Signed)
Virtual Visit via Video Note  I connected with Monica Spencer on 12/12/18 at  4:00 PM EDT by a video enabled telemedicine application and verified that I am speaking with the correct person using two identifiers.  Location patient: home Location provider:work or home office Persons participating in the virtual visit: patient, provider  I discussed the limitations of evaluation and management by telemedicine and the availability of in person appointments. The patient expressed understanding and agreed to proceed.   HPI: Pt needs a refill on meds (atenolol and levothyroxine).  Pt states she was due for her CPE, but has reschediled it for July 2020 given COVID-19 pandemic.  Pt denies HAs, palpitations, hair loss, constipation, diarrhea, heat or cold intolerance.  Pt does mention she is due for repeat colonoscopy this yr.  Last colonoscopy was in 2017 polyps noted, done at [facility], mentioned in this provider's note 12/04/17.  Pt states the health maintenance list has the colonoscopy due in 10 yrs which is incorrect.  Pt's mom, dad, and brother all with h/o colon cancer.   ROS: See pertinent positives and negatives per HPI.  Past Medical History:  Diagnosis Date  . Hypertension   . Thyroid disease     Past Surgical History:  Procedure Laterality Date  . THYROIDECTOMY      No family history on file.  SOCIAL HX:    Current Outpatient Medications:  .  atenolol (TENORMIN) 25 MG tablet, TAKE 1 TABLET DAILY, Disp: 90 tablet, Rfl: 0 .  levothyroxine (SYNTHROID, LEVOTHROID) 100 MCG tablet, Take 1 tablet (100 mcg total) by mouth daily before breakfast., Disp: 90 tablet, Rfl: 0  EXAM:  VITALS per patient if applicable:  RR between 12-20 bpm  GENERAL: alert, oriented, appears well and in no acute distress  HEENT: atraumatic, conjunctiva clear, no obvious abnormalities on inspection of external nose and ears  NECK: normal movements of the head and neck  LUNGS: on inspection  no signs of respiratory distress, breathing rate appears normal, no obvious gross SOB, gasping or wheezing  CV: no obvious cyanosis  MS: moves all visible extremities without noticeable abnormality  PSYCH/NEURO: pleasant and cooperative, no obvious depression or anxiety, speech and thought processing grossly intact  ASSESSMENT AND PLAN:  Discussed the following assessment and plan:  Polyp of colon, unspecified part of colon, unspecified type  - Plan: Ambulatory referral to Gastroenterology  Postoperative hypothyroidism  -will obtain labs in the next few months, sooner if needed. - Plan: levothyroxine (SYNTHROID) 100 MCG tablet  Essential hypertension  -controlled - Plan: atenolol (TENORMIN) 25 MG tablet  F/u prn in the next few months   I discussed the assessment and treatment plan with the patient. The patient was provided an opportunity to ask questions and all were answered. The patient agreed with the plan and demonstrated an understanding of the instructions.   The patient was advised to call back or seek an in-person evaluation if the symptoms worsen or if the condition fails to improve as anticipated.   Billie Ruddy, MD

## 2019-01-30 DIAGNOSIS — N6452 Nipple discharge: Secondary | ICD-10-CM | POA: Diagnosis not present

## 2019-01-31 ENCOUNTER — Other Ambulatory Visit: Payer: Self-pay | Admitting: Obstetrics and Gynecology

## 2019-01-31 DIAGNOSIS — N6452 Nipple discharge: Secondary | ICD-10-CM

## 2019-02-25 ENCOUNTER — Other Ambulatory Visit: Payer: Self-pay | Admitting: Obstetrics and Gynecology

## 2019-02-25 ENCOUNTER — Ambulatory Visit
Admission: RE | Admit: 2019-02-25 | Discharge: 2019-02-25 | Disposition: A | Discharge status: Home / Self Care | Payer: BC Managed Care – PPO | Source: Ambulatory Visit | Attending: Obstetrics and Gynecology | Admitting: Obstetrics and Gynecology

## 2019-02-25 ENCOUNTER — Encounter: Payer: BLUE CROSS/BLUE SHIELD | Admitting: Family Medicine

## 2019-02-25 ENCOUNTER — Ambulatory Visit
Admission: RE | Admit: 2019-02-25 | Discharge: 2019-02-25 | Disposition: A | Discharge status: Home / Self Care | Payer: BLUE CROSS/BLUE SHIELD | Source: Ambulatory Visit | Attending: Obstetrics and Gynecology | Admitting: Obstetrics and Gynecology

## 2019-02-25 DIAGNOSIS — N6452 Nipple discharge: Secondary | ICD-10-CM

## 2019-02-25 DIAGNOSIS — Z6841 Body Mass Index (BMI) 40.0 and over, adult: Secondary | ICD-10-CM | POA: Diagnosis not present

## 2019-02-25 DIAGNOSIS — N6311 Unspecified lump in the right breast, upper outer quadrant: Secondary | ICD-10-CM | POA: Diagnosis not present

## 2019-02-25 DIAGNOSIS — N6310 Unspecified lump in the right breast, unspecified quadrant: Secondary | ICD-10-CM

## 2019-02-25 DIAGNOSIS — Z01419 Encounter for gynecological examination (general) (routine) without abnormal findings: Secondary | ICD-10-CM | POA: Diagnosis not present

## 2019-02-25 DIAGNOSIS — R921 Mammographic calcification found on diagnostic imaging of breast: Secondary | ICD-10-CM | POA: Diagnosis not present

## 2019-02-25 DIAGNOSIS — N6312 Unspecified lump in the right breast, upper inner quadrant: Secondary | ICD-10-CM | POA: Diagnosis not present

## 2019-02-27 ENCOUNTER — Other Ambulatory Visit: Payer: Self-pay

## 2019-02-27 ENCOUNTER — Ambulatory Visit
Admission: RE | Admit: 2019-02-27 | Discharge: 2019-02-27 | Disposition: A | Discharge status: Home / Self Care | Payer: BC Managed Care – PPO | Source: Ambulatory Visit | Attending: Obstetrics and Gynecology | Admitting: Obstetrics and Gynecology

## 2019-02-27 DIAGNOSIS — N6310 Unspecified lump in the right breast, unspecified quadrant: Secondary | ICD-10-CM

## 2019-02-27 DIAGNOSIS — N6311 Unspecified lump in the right breast, upper outer quadrant: Secondary | ICD-10-CM | POA: Diagnosis not present

## 2019-02-27 DIAGNOSIS — N6315 Unspecified lump in the right breast, overlapping quadrants: Secondary | ICD-10-CM | POA: Diagnosis not present

## 2019-02-27 DIAGNOSIS — N6452 Nipple discharge: Secondary | ICD-10-CM

## 2019-02-27 DIAGNOSIS — D241 Benign neoplasm of right breast: Secondary | ICD-10-CM | POA: Diagnosis not present

## 2019-02-27 DIAGNOSIS — N6312 Unspecified lump in the right breast, upper inner quadrant: Secondary | ICD-10-CM | POA: Diagnosis not present

## 2019-02-27 DIAGNOSIS — N6011 Diffuse cystic mastopathy of right breast: Secondary | ICD-10-CM | POA: Diagnosis not present

## 2019-03-01 ENCOUNTER — Other Ambulatory Visit: Payer: Self-pay | Admitting: Obstetrics and Gynecology

## 2019-03-01 DIAGNOSIS — D241 Benign neoplasm of right breast: Secondary | ICD-10-CM

## 2019-03-01 DIAGNOSIS — H35372 Puckering of macula, left eye: Secondary | ICD-10-CM | POA: Diagnosis not present

## 2019-03-05 ENCOUNTER — Ambulatory Visit
Admission: RE | Admit: 2019-03-05 | Discharge: 2019-03-05 | Disposition: A | Discharge status: Home / Self Care | Payer: BC Managed Care – PPO | Source: Ambulatory Visit | Attending: Obstetrics and Gynecology | Admitting: Obstetrics and Gynecology

## 2019-03-05 ENCOUNTER — Other Ambulatory Visit: Payer: Self-pay

## 2019-03-05 DIAGNOSIS — N6011 Diffuse cystic mastopathy of right breast: Secondary | ICD-10-CM | POA: Diagnosis not present

## 2019-03-05 DIAGNOSIS — R921 Mammographic calcification found on diagnostic imaging of breast: Secondary | ICD-10-CM | POA: Diagnosis not present

## 2019-03-05 DIAGNOSIS — D241 Benign neoplasm of right breast: Secondary | ICD-10-CM

## 2019-03-06 ENCOUNTER — Encounter: Payer: Self-pay | Admitting: Family Medicine

## 2019-03-06 ENCOUNTER — Other Ambulatory Visit: Payer: Self-pay

## 2019-03-06 ENCOUNTER — Ambulatory Visit (INDEPENDENT_AMBULATORY_CARE_PROVIDER_SITE_OTHER): Payer: BC Managed Care – PPO | Admitting: Family Medicine

## 2019-03-06 VITALS — BP 118/80 | HR 61 | Temp 98.3°F | Ht 65.5 in | Wt 254.4 lb

## 2019-03-06 DIAGNOSIS — Z0000 Encounter for general adult medical examination without abnormal findings: Secondary | ICD-10-CM | POA: Diagnosis not present

## 2019-03-06 DIAGNOSIS — D369 Benign neoplasm, unspecified site: Secondary | ICD-10-CM | POA: Diagnosis not present

## 2019-03-06 DIAGNOSIS — Z87898 Personal history of other specified conditions: Secondary | ICD-10-CM

## 2019-03-06 DIAGNOSIS — E782 Mixed hyperlipidemia: Secondary | ICD-10-CM | POA: Diagnosis not present

## 2019-03-06 DIAGNOSIS — E890 Postprocedural hypothyroidism: Secondary | ICD-10-CM | POA: Diagnosis not present

## 2019-03-06 LAB — CBC WITH DIFFERENTIAL/PLATELET
Basophils Absolute: 0 10*3/uL (ref 0.0–0.1)
Basophils Relative: 0.5 % (ref 0.0–3.0)
Eosinophils Absolute: 0 10*3/uL (ref 0.0–0.7)
Eosinophils Relative: 0.5 % (ref 0.0–5.0)
HCT: 35.6 % — ABNORMAL LOW (ref 36.0–46.0)
Hemoglobin: 11.6 g/dL — ABNORMAL LOW (ref 12.0–15.0)
Lymphocytes Relative: 47.9 % — ABNORMAL HIGH (ref 12.0–46.0)
Lymphs Abs: 2.9 10*3/uL (ref 0.7–4.0)
MCHC: 32.5 g/dL (ref 30.0–36.0)
MCV: 87.6 fl (ref 78.0–100.0)
Monocytes Absolute: 0.6 10*3/uL (ref 0.1–1.0)
Monocytes Relative: 10.6 % (ref 3.0–12.0)
Neutro Abs: 2.4 10*3/uL (ref 1.4–7.7)
Neutrophils Relative %: 40.5 % — ABNORMAL LOW (ref 43.0–77.0)
Platelets: 364 10*3/uL (ref 150.0–400.0)
RBC: 4.07 Mil/uL (ref 3.87–5.11)
RDW: 13.8 % (ref 11.5–15.5)
WBC: 6 10*3/uL (ref 4.0–10.5)

## 2019-03-06 LAB — BASIC METABOLIC PANEL
BUN: 10 mg/dL (ref 6–23)
CO2: 27 mEq/L (ref 19–32)
Calcium: 9.6 mg/dL (ref 8.4–10.5)
Chloride: 103 mEq/L (ref 96–112)
Creatinine, Ser: 0.95 mg/dL (ref 0.40–1.20)
GFR: 75.19 mL/min (ref >60.00)
Glucose, Bld: 74 mg/dL (ref 70–99)
Potassium: 4.1 mEq/L (ref 3.5–5.1)
Sodium: 138 mEq/L (ref 135–145)

## 2019-03-06 LAB — LIPID PANEL
Cholesterol: 228 mg/dL — ABNORMAL HIGH (ref 0–200)
HDL: 45 mg/dL (ref >39.00)
LDL Cholesterol: 156 mg/dL — ABNORMAL HIGH (ref 0–99)
NonHDL: 183.03
Total CHOL/HDL Ratio: 5
Triglycerides: 136 mg/dL (ref 0.0–149.0)
VLDL: 27.2 mg/dL (ref 0.0–40.0)

## 2019-03-06 LAB — HEMOGLOBIN A1C: Hgb A1c MFr Bld: 5.9 % (ref 4.6–6.5)

## 2019-03-06 LAB — TSH: TSH: 0.9 u[IU]/mL (ref 0.35–4.50)

## 2019-03-06 NOTE — Patient Instructions (Signed)
Preventive Care 40-50 Years Old, Female °Preventive care refers to visits with your health care provider and lifestyle choices that can promote health and wellness. This includes: °· A yearly physical exam. This may also be called an annual well check. °· Regular dental visits and eye exams. °· Immunizations. °· Screening for certain conditions. °· Healthy lifestyle choices, such as eating a healthy diet, getting regular exercise, not using drugs or products that contain nicotine and tobacco, and limiting alcohol use. °What can I expect for my preventive care visit? °Physical exam °Your health care provider will check your: °· Height and weight. This may be used to calculate body mass index (BMI), which tells if you are at a healthy weight. °· Heart rate and blood pressure. °· Skin for abnormal spots. °Counseling °Your health care provider may ask you questions about your: °· Alcohol, tobacco, and drug use. °· Emotional well-being. °· Home and relationship well-being. °· Sexual activity. °· Eating habits. °· Work and work environment. °· Method of birth control. °· Menstrual cycle. °· Pregnancy history. °What immunizations do I need? ° °Influenza (flu) vaccine °· This is recommended every year. °Tetanus, diphtheria, and pertussis (Tdap) vaccine °· You may need a Td booster every 10 years. °Varicella (chickenpox) vaccine °· You may need this if you have not been vaccinated. °Zoster (shingles) vaccine °· You may need this after age 60. °Measles, mumps, and rubella (MMR) vaccine °· You may need at least one dose of MMR if you were born in 1957 or later. You may also need a second dose. °Pneumococcal conjugate (PCV13) vaccine °· You may need this if you have certain conditions and were not previously vaccinated. °Pneumococcal polysaccharide (PPSV23) vaccine °· You may need one or two doses if you smoke cigarettes or if you have certain conditions. °Meningococcal conjugate (MenACWY) vaccine °· You may need this if you  have certain conditions. °Hepatitis A vaccine °· You may need this if you have certain conditions or if you travel or work in places where you may be exposed to hepatitis A. °Hepatitis B vaccine °· You may need this if you have certain conditions or if you travel or work in places where you may be exposed to hepatitis B. °Haemophilus influenzae type b (Hib) vaccine °· You may need this if you have certain conditions. °Human papillomavirus (HPV) vaccine °· If recommended by your health care provider, you may need three doses over 6 months. °You may receive vaccines as individual doses or as more than one vaccine together in one shot (combination vaccines). Talk with your health care provider about the risks and benefits of combination vaccines. °What tests do I need? °Blood tests °· Lipid and cholesterol levels. These may be checked every 5 years, or more frequently if you are over 50 years old. °· Hepatitis C test. °· Hepatitis B test. °Screening °· Lung cancer screening. You may have this screening every year starting at age 55 if you have a 30-pack-year history of smoking and currently smoke or have quit within the past 15 years. °· Colorectal cancer screening. All adults should have this screening starting at age 50 and continuing until age 75. Your health care provider may recommend screening at age 45 if you are at increased risk. You will have tests every 1-10 years, depending on your results and the type of screening test. °· Diabetes screening. This is done by checking your blood sugar (glucose) after you have not eaten for a while (fasting). You may have this   done every 1-3 years.  Mammogram. This may be done every 1-2 years. Talk with your health care provider about when you should start having regular mammograms. This may depend on whether you have a family history of breast cancer.  BRCA-related cancer screening. This may be done if you have a family history of breast, ovarian, tubal, or peritoneal  cancers.  Pelvic exam and Pap test. This may be done every 3 years starting at age 74. Starting at age 59, this may be done every 5 years if you have a Pap test in combination with an HPV test. Other tests  Sexually transmitted disease (STD) testing.  Bone density scan. This is done to screen for osteoporosis. You may have this scan if you are at high risk for osteoporosis. Follow these instructions at home: Eating and drinking  Eat a diet that includes fresh fruits and vegetables, whole grains, lean protein, and low-fat dairy.  Take vitamin and mineral supplements as recommended by your health care provider.  Do not drink alcohol if: ? Your health care provider tells you not to drink. ? You are pregnant, may be pregnant, or are planning to become pregnant.  If you drink alcohol: ? Limit how much you have to 0-1 drink a day. ? Be aware of how much alcohol is in your drink. In the [place] one drink equals one 12 oz bottle of beer (355 mL), one 5 oz glass of wine (148 mL), or one 1 oz glass of hard liquor (44 mL). Lifestyle  Take daily care of your teeth and gums.  Stay active. Exercise for at least 30 minutes on 5 or more days each week.  Do not use any products that contain nicotine or tobacco, such as cigarettes, e-cigarettes, and chewing tobacco. If you need help quitting, ask your health care provider.  If you are sexually active, practice safe sex. Use a condom or other form of birth control (contraception) in order to prevent pregnancy and STIs (sexually transmitted infections).  If told by your health care provider, take low-dose aspirin daily starting at age 53. What's next?  Visit your health care provider once a year for a well check visit.  Ask your health care provider how often you should have your eyes and teeth checked.  Stay up to date on all vaccines. This information is not intended to replace advice given to you by your health care provider. Make sure you  discuss any questions you have with your health care provider. Document Released: 08/28/2015 Document Revised: 04/12/2018 Document Reviewed: 04/12/2018 Elsevier Patient Education  Monica Spencer.  Hypothyroidism  Hypothyroidism is when the thyroid gland does not make enough of certain hormones (it is underactive). The thyroid gland is a small gland located in the lower front part of the neck, just in front of the windpipe (trachea). This gland makes hormones that help control how the body uses food for energy (metabolism) as well as how the heart and brain function. These hormones also play a role in keeping your bones strong. When the thyroid is underactive, it produces too little of the hormones thyroxine (T4) and triiodothyronine (T3). What are the causes? This condition may be caused by:  Hashimoto's disease. This is a disease in which the body's disease-fighting system (immune system) attacks the thyroid gland. This is the most common cause.  Viral infections.  Pregnancy.  Certain medicines.  Birth defects.  Past radiation treatments to the head or neck for cancer.  Past treatment with  radioactive iodine.  Past exposure to radiation in the environment.  Past surgical removal of part or all of the thyroid.  Problems with a gland in the center of the brain (pituitary gland).  Lack of enough iodine in the diet. What increases the risk? You are more likely to develop this condition if:  You are female.  You have a family history of thyroid conditions.  You use a medicine called lithium.  You take medicines that affect the immune system (immunosuppressants). What are the signs or symptoms? Symptoms of this condition include:  Feeling as though you have no energy (lethargy).  Not being able to tolerate cold.  Weight gain that is not explained by a change in diet or exercise habits.  Lack of appetite.  Dry skin.  Coarse hair.  Menstrual  irregularity.  Slowing of thought processes.  Constipation.  Sadness or depression. How is this diagnosed? This condition may be diagnosed based on:  Your symptoms, your medical history, and a physical exam.  Blood tests. You may also have imaging tests, such as an ultrasound or MRI. How is this treated? This condition is treated with medicine that replaces the thyroid hormones that your body does not make. After you begin treatment, it may take several weeks for symptoms to go away. Follow these instructions at home:  Take over-the-counter and prescription medicines only as told by your health care provider.  If you start taking any new medicines, tell your health care provider.  Keep all follow-up visits as told by your health care provider. This is important. ? As your condition improves, your dosage of thyroid hormone medicine may change. ? You will need to have blood tests regularly so that your health care provider can monitor your condition. Contact a health care provider if:  Your symptoms do not get better with treatment.  You are taking thyroid replacement medicine and you: ? Sweat a lot. ? Have tremors. ? Feel anxious. ? Lose weight rapidly. ? Cannot tolerate heat. ? Have emotional swings. ? Have diarrhea. ? Feel weak. Get help right away if you have:  Chest pain.  An irregular heartbeat.  A rapid heartbeat.  Difficulty breathing. Summary  Hypothyroidism is when the thyroid gland does not make enough of certain hormones (it is underactive).  When the thyroid is underactive, it produces too little of the hormones thyroxine (T4) and triiodothyronine (T3).  The most common cause is Hashimoto's disease, a disease in which the body's disease-fighting system (immune system) attacks the thyroid gland. The condition can also be caused by viral infections, medicine, pregnancy, or past radiation treatment to the head or neck.  Symptoms may include weight gain,  dry skin, constipation, feeling as though you do not have energy, and not being able to tolerate cold.  This condition is treated with medicine to replace the thyroid hormones that your body does not make. This information is not intended to replace advice given to you by your health care provider. Make sure you discuss any questions you have with your health care provider. Document Released: 08/01/2005 Document Revised: 07/14/2017 Document Reviewed: 07/12/2017 Elsevier Patient Education  Monica Spencer.  Dyslipidemia Dyslipidemia is an imbalance of waxy, fat-like substances (lipids) in the blood. The body needs lipids in small amounts. Dyslipidemia often involves a high level of cholesterol or triglycerides, which are types of lipids. Common forms of dyslipidemia include:  High levels of LDL cholesterol. LDL is the type of cholesterol that causes fatty deposits (plaques) to  build up in the blood vessels that carry blood away from your heart (arteries).  Low levels of HDL cholesterol. HDL cholesterol is the type of cholesterol that protects against heart disease. High levels of HDL remove the LDL buildup from arteries.  High levels of triglycerides. Triglycerides are a fatty substance in the blood that is linked to a buildup of plaques in the arteries. What are the causes? Primary dyslipidemia is caused by changes (mutations) in genes that are passed down through families (inherited). These mutations cause several types of dyslipidemia. Secondary dyslipidemia is caused by lifestyle choices and diseases that lead to dyslipidemia, such as:  Eating a diet that is high in animal fat.  Not getting enough exercise.  Having diabetes, kidney disease, liver disease, or thyroid disease.  Drinking large amounts of alcohol.  Using certain medicines. What increases the risk? You are more likely to develop this condition if you are an older man or if you are a woman who has gone through  menopause. Other risk factors include:  Having a family history of dyslipidemia.  Taking certain medicines, including birth control pills, steroids, some diuretics, and beta-blockers.  Smoking cigarettes.  Eating a high-fat diet.  Having certain medical conditions such as diabetes, polycystic ovary syndrome (PCOS), kidney disease, liver disease, or hypothyroidism.  Not exercising regularly.  Being overweight or obese with too much belly fat. What are the signs or symptoms? In most cases, dyslipidemia does not usually cause any symptoms. In severe cases, very high lipid levels can cause:  Fatty bumps under the skin (xanthomas).  White or gray ring around the black center (pupil) of the eye. Very high triglyceride levels can cause inflammation of the pancreas (pancreatitis). How is this diagnosed? Your health care provider may diagnose dyslipidemia based on a routine blood test (fasting blood test). Because most people do not have symptoms of the condition, this blood testing (lipid profile) is done on adults age 81 and older and is repeated every 5 years. This test checks:  Total cholesterol. This measures the total amount of cholesterol in your blood, including LDL cholesterol, HDL cholesterol, and triglycerides. A healthy number is below 200.  LDL cholesterol. The target number for LDL cholesterol is different for each person, depending on individual risk factors. Ask your health care provider what your LDL cholesterol should be.  HDL cholesterol. An HDL level of 60 or higher is best because it helps to protect against heart disease. A number below 60 for men or below 28 for women increases the risk for heart disease.  Triglycerides. A healthy triglyceride number is below 150. If your lipid profile is abnormal, your health care provider may do other blood tests. How is this treated? Treatment depends on the type of dyslipidemia that you have and your other risk factors for heart  disease and stroke. Your health care provider will have a target range for your lipid levels based on this information. For many people, this condition may be treated by lifestyle changes, such as diet and exercise. Your health care provider may recommend that you:  Get regular exercise.  Make changes to your diet.  Quit smoking if you smoke. If diet changes and exercise do not help you reach your goals, your health care provider may also prescribe medicine to lower lipids. The most commonly prescribed type of medicine lowers your LDL cholesterol (statin drug). If you have a high triglyceride level, your provider may prescribe another type of drug (fibrate) or an omega-3 fish  oil supplement, or both. Follow these instructions at home:  Eating and drinking  Follow instructions from your health care provider or dietitian about eating or drinking restrictions.  Eat a healthy diet as told by your health care provider. This can help you reach and maintain a healthy weight, lower your LDL cholesterol, and raise your HDL cholesterol. This may include: ? Limiting your calories, if you are overweight. ? Eating more fruits, vegetables, whole grains, fish, and lean meats. ? Limiting saturated fat, trans fat, and cholesterol.  If you drink alcohol: ? Limit how much you use. ? Be aware of how much alcohol is in your drink. In the [place] one drink equals one 12 oz bottle of beer (355 mL), one 5 oz glass of wine (148 mL), or one 1 oz glass of hard liquor (44 mL).  Do not drink alcohol if: ? Your health care provider tells you not to drink. ? You are pregnant, may be pregnant, or are planning to become pregnant. Activity  Get regular exercise. Start an exercise and strength training program as told by your health care provider. Ask your health care provider what activities are safe for you. Your health care provider may recommend: ? 30 minutes of aerobic activity 4-6 days a week. Brisk walking is an  example of aerobic activity. ? Strength training 2 days a week. General instructions  Do not use any products that contain nicotine or tobacco, such as cigarettes, e-cigarettes, and chewing tobacco. If you need help quitting, ask your health care provider.  Take over-the-counter and prescription medicines only as told by your health care provider. This includes supplements.  Keep all follow-up visits as told by your health care provider. Contact a health care provider if:  You are: ? Having trouble sticking to your exercise or diet plan. ? Struggling to quit smoking or control your use of alcohol. Summary  Dyslipidemia often involves a high level of cholesterol or triglycerides, which are types of lipids.  Treatment depends on the type of dyslipidemia that you have and your other risk factors for heart disease and stroke.  For many people, treatment starts with lifestyle changes, such as diet and exercise.  Your health care provider may prescribe medicine to lower lipids. This information is not intended to replace advice given to you by your health care provider. Make sure you discuss any questions you have with your health care provider. Document Released: 08/06/2013 Document Revised: 03/26/2018 Document Reviewed: 03/02/2018 Elsevier Patient Education  Monica Spencer.

## 2019-03-06 NOTE — Progress Notes (Signed)
Subjective:     Monica Spencer is a 48 y.o. female and is here for a comprehensive physical exam. The patient reports problems - s/p breast biopsy.  Pt seen by OB/Gyn for well women exam, noted h/o bloody nipple d/c from R breast.  Mammogram done and biopsy of R breast with ductal papilloma.  Pt has f/u with surgery scheduled.  Pt notes h/o large breast with permanent indentions in shoulders from bra straps.  Otherwise pt is doing well.  BP well controlled on atenolol.  Patient denies symptoms of hypothyroidism.  Currently taking levothyroxine 100 mcg daily.   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  Pt states she is not stressed about the plans for the upcoming school year 2/2 COVID-19 pandemic. Pt has plans to see her brother and sister over the weekend.  This is the first time she will be seen on 21 August 2018.  Social History   Socioeconomic History  . Marital status: Married    Spouse name: Not on file  . Number of children: Not on file  . Years of education: Not on file  . Highest education level: Not on file  Occupational History  . Not on file  Social Needs  . Financial resource strain: Not on file  . Food insecurity    Worry: Not on file    Inability: Not on file  . Transportation needs    Medical: Not on file    Non-medical: Not on file  Tobacco Use  . Smoking status: Never Smoker  . Smokeless tobacco: Never Used  Substance and Sexual Activity  . Alcohol use: Never    Frequency: Never  . Drug use: Never  . Sexual activity: Not on file  Lifestyle  . Physical activity    Days per week: Not on file    Minutes per session: Not on file  . Stress: Not on file  Relationships  . Social Herbalist on phone: Not on file    Gets together: Not on file    Attends religious service: Not on file    Active member of club or organization: Not on file    Attends meetings of clubs or organizations: Not on file    Relationship  status: Not on file  . Intimate partner violence    Fear of current or ex partner: Not on file    Emotionally abused: Not on file    Physically abused: Not on file    Forced sexual activity: Not on file  Other Topics Concern  . Not on file  Social History Narrative  . Not on file   Health Maintenance  Topic Date Due  . HIV Screening  09/07/1983  . TETANUS/TDAP  09/07/1987  . INFLUENZA VACCINE  03/16/2019  . MAMMOGRAM  02/24/2021  . PAP SMEAR-Modifier  02/26/2022  . COLONOSCOPY  01/27/2026    The following portions of the patient's history were reviewed and updated as appropriate: allergies, current medications, past family history, past medical history, past social history, past surgical history and problem list.  Review of Systems Pertinent items noted in HPI and remainder of comprehensive ROS otherwise negative.   Objective:    BP 118/80 (BP Location: Right Arm, Patient Position: Sitting, Cuff Size: Large)   Pulse 61   Temp 98.3 F (36.8 C) (Oral)   Ht 5' 5.5" (1.664 m)   Wt 254 lb 6.4 oz (115.4 kg)   SpO2 96%  BMI 41.69 kg/m  General appearance: alert, cooperative and no distress Head: Normocephalic, without obvious abnormality, atraumatic Eyes: conjunctivae/corneas clear. PERRL, EOM's intact. Fundi benign. Ears: normal TM's and external ear canals both ears Nose: Nares normal. Septum midline. Mucosa normal. No drainage or sinus tenderness. Throat: lips, mucosa, and tongue normal; teeth and gums normal Lungs: clear to auscultation bilaterally Heart: regular rate and rhythm, S1, S2 normal, no murmur, click, rub or gallop Abdomen: soft, non-tender; bowel sounds normal; no masses,  no organomegaly Extremities: extremities normal, atraumatic, no cyanosis or edema Pulses: 2+ and symmetric Skin: Skin color, texture, turgor normal. No rashes or lesions Lymph nodes: Cervical, supraclavicular, and axillary nodes normal. Neurologic: Alert and oriented X 3, normal strength  and tone. Normal symmetric reflexes. Normal coordination and gait    Assessment:    Healthy female exam with recent d/x of ductal papilloma of R breast.     Plan:     Anticipatory guidance given including wearing seatbelts, smoke detectors in the home, increasing physical activity, increasing p.o. intake of water and vegetables. -will obtain labs -given handouts -pap up to date done 02/25/19 -mammogram done 02/2019 See After Visit Summary for Counseling Recommendations    R breast ductal papilloma -s/p mammogram and biopsy -encouraged to keep f/u with surgery  HLD -lipid panel  H/o preDM -hgb A1C ordered  Post operative hypothyroidism -continue levothyroxine 100 mcg daily -will obtain TSH  F/u prn  Grier Mitts, MD

## 2019-03-11 DIAGNOSIS — D241 Benign neoplasm of right breast: Secondary | ICD-10-CM | POA: Diagnosis not present

## 2019-03-18 ENCOUNTER — Telehealth: Payer: Self-pay

## 2019-03-18 NOTE — Telephone Encounter (Signed)

## 2019-03-19 ENCOUNTER — Encounter: Payer: Self-pay | Admitting: Plastic Surgery

## 2019-03-19 ENCOUNTER — Ambulatory Visit: Payer: BC Managed Care – PPO | Admitting: Plastic Surgery

## 2019-03-19 ENCOUNTER — Other Ambulatory Visit: Payer: Self-pay

## 2019-03-19 VITALS — BP 149/86 | HR 76 | Temp 97.6°F | Ht 65.0 in | Wt 257.2 lb

## 2019-03-19 DIAGNOSIS — G8929 Other chronic pain: Secondary | ICD-10-CM

## 2019-03-19 DIAGNOSIS — M542 Cervicalgia: Secondary | ICD-10-CM | POA: Insufficient documentation

## 2019-03-19 DIAGNOSIS — N62 Hypertrophy of breast: Secondary | ICD-10-CM | POA: Insufficient documentation

## 2019-03-19 DIAGNOSIS — M546 Pain in thoracic spine: Secondary | ICD-10-CM | POA: Diagnosis not present

## 2019-03-19 DIAGNOSIS — D369 Benign neoplasm, unspecified site: Secondary | ICD-10-CM

## 2019-03-19 DIAGNOSIS — M549 Dorsalgia, unspecified: Secondary | ICD-10-CM | POA: Insufficient documentation

## 2019-03-19 NOTE — Progress Notes (Signed)
Patient ID: Monica Spencer, female    DOB: Nov 01, 1970, 48 y.o.   MRN: [id number]   Chief Complaint  Patient presents with  . Advice Only    for reduction lumpectomy  . Breast Problem    The patient is a 48 year old female here for evaluation of her breasts.  She was recently diagnosed with a right-sided papilloma she is going to have this excised.  She will likely have resulting breast asymmetry and possibly deformity due to the resection.  She is 5 feet 5 inches tall and weighs 257 pounds.  Her current bra size is a 40 K.  She would like to be as small as possible, C or D cup.   She has extreme grooving of her shoulders due to the weight of her bra.  The breasts are extremely large with significant ptosis.  The right breast is slightly larger than the left.  She has to buy special bras through mail order because she cannot find them in the stores.  She often has to supplement with pins and sports bras.  She complains of pain in her neck and shoulders.  This is not helped by Motrin or Tylenol.  Activities that are hindered from the enlarged breasts include running and exercise.  She has hyperpigmentation of the inframammary area on both sides.  The sternal to nipple distance on the right is 42 cm and the left is 41 cm.  The IMF distance is 25 cm.  The estimated excess breast tissue to be removed at the time of surgery = 1100 grams on the left and 1100 grams on the right.  Mammogram history: Up to date with abnormality that has led her to the point of a coordinated surgery with general surgery.  2.4   Review of Systems  Constitutional: Positive for activity change. Negative for appetite change.  HENT: Negative.   Eyes: Negative.   Respiratory: Negative.  Negative for chest tightness and shortness of breath.   Cardiovascular: Negative for leg swelling.  Gastrointestinal: Negative for abdominal pain.  Endocrine: Negative.   Musculoskeletal: Positive for back pain and neck pain.   Skin: Positive for color change. Negative for wound.  Hematological: Negative.   Psychiatric/Behavioral: Negative.     Past Medical History:  Diagnosis Date  . Hypertension   . Thyroid disease     Past Surgical History:  Procedure Laterality Date  . THYROIDECTOMY        Current Outpatient Medications:  .  atenolol (TENORMIN) 25 MG tablet, Take 1 tablet (25 mg total) by mouth daily., Disp: 90 tablet, Rfl: 3 .  Cholecalciferol 125 MCG (5000 UT) capsule, Take 5,000 Units by mouth daily., Disp: , Rfl:  .  levothyroxine (SYNTHROID) 100 MCG tablet, Take 1 tablet (100 mcg total) by mouth daily before breakfast., Disp: 90 tablet, Rfl: 3 .  OVER THE COUNTER MEDICATION, Centrum Silver-Take 1 tablet by mouth daily., Disp: , Rfl:    Objective:   Vitals:   03/19/19 1626  BP: (!) 149/86  Pulse: 76  Temp: 97.6 F (36.4 C)  SpO2: 97%    Physical Exam Vitals signs and nursing note reviewed.  Constitutional:      Appearance: Normal appearance.  HENT:     Head: Normocephalic and atraumatic.     Nose: Nose normal.  Eyes:     Extraocular Movements: Extraocular movements intact.     Pupils: Pupils are equal, round, and reactive to light.  Cardiovascular:     Rate  and Rhythm: Normal rate.  Pulmonary:     Effort: Pulmonary effort is normal. No respiratory distress.     Breath sounds: No wheezing.  Abdominal:     General: There is no distension.  Musculoskeletal: Normal range of motion.  Skin:    Capillary Refill: Capillary refill takes less than 2 seconds.  Neurological:     General: No focal deficit present.     Mental Status: She is alert and oriented to person, place, and time.  Psychiatric:        Mood and Affect: Mood normal.        Behavior: Behavior normal.        Thought Content: Thought content normal.        Judgment: Judgment normal.     Assessment & Plan:     ICD-10-CM   1. Ductal papilloma, R breast  D36.9   2. Neck pain  M54.2   3. Chronic bilateral  thoracic back pain  M54.6    G89.29   4. Symptomatic mammary hypertrophy  N62     Recommend bilateral breast reduction in coordination with general surgery for resection of right breast papilloma.  Pictures were obtained of the patient and placed in the chart with the patient's or guardian's permission.  [place], DO

## 2019-04-01 ENCOUNTER — Ambulatory Visit: Payer: Self-pay | Admitting: General Surgery

## 2019-04-01 ENCOUNTER — Other Ambulatory Visit: Payer: Self-pay | Admitting: General Surgery

## 2019-04-01 DIAGNOSIS — D369 Benign neoplasm, unspecified site: Secondary | ICD-10-CM

## 2019-04-18 ENCOUNTER — Telehealth: Payer: Self-pay

## 2019-04-18 NOTE — Telephone Encounter (Signed)

## 2019-04-19 ENCOUNTER — Encounter: Payer: Self-pay | Admitting: Plastic Surgery

## 2019-04-19 ENCOUNTER — Other Ambulatory Visit: Payer: Self-pay

## 2019-04-19 ENCOUNTER — Ambulatory Visit (INDEPENDENT_AMBULATORY_CARE_PROVIDER_SITE_OTHER): Payer: BC Managed Care – PPO | Admitting: Plastic Surgery

## 2019-04-19 VITALS — BP 134/78 | HR 77 | Temp 98.3°F | Ht 65.0 in | Wt 255.0 lb

## 2019-04-19 DIAGNOSIS — N62 Hypertrophy of breast: Secondary | ICD-10-CM

## 2019-04-19 DIAGNOSIS — D369 Benign neoplasm, unspecified site: Secondary | ICD-10-CM

## 2019-04-19 MED ORDER — CEPHALEXIN 500 MG PO CAPS: 500.0000 mg | ORAL_CAPSULE | Freq: Four times a day (QID) | ORAL | 0 refills | Status: AC

## 2019-04-19 MED ORDER — ONDANSETRON HCL 4 MG PO TABS: 4.0000 mg | ORAL_TABLET | Freq: Three times a day (TID) | ORAL | 0 refills | Status: AC | PRN

## 2019-04-19 MED ORDER — HYDROCODONE-ACETAMINOPHEN 5-325 MG PO TABS: 1.0000 | ORAL_TABLET | Freq: Four times a day (QID) | ORAL | 0 refills | Status: AC | PRN

## 2019-04-19 NOTE — H&P (View-Only) (Signed)
Patient ID: Monica Spencer, female    DOB: 01/15/1971, 48 y.o.   MRN: [id number]   Chief Complaint  Patient presents with  . Breast Problem    The patient is a 48 year old female here for her history and physical.  The patient was diagnosed with severe papillary disease of the right breast.  She is 5 feet 5 inches tall and weighs 257 pounds her bra size is a 40K.  We are planning to remove 1100 g from each breast.  We will work in coordination with Dr. Marlou Starks for removal of the breast disease.   Review of Systems  Constitutional: Negative.  Negative for activity change.  HENT: Negative.   Eyes: Negative.   Respiratory: Negative.   Cardiovascular: Negative.   Gastrointestinal: Negative.   Endocrine: Negative.   Genitourinary: Negative.   Musculoskeletal: Positive for back pain and neck pain.  Neurological: Negative.   Hematological: Negative.   Psychiatric/Behavioral: Negative.     Past Medical History:  Diagnosis Date  . Hypertension   . Thyroid disease     Past Surgical History:  Procedure Laterality Date  . THYROIDECTOMY        Current Outpatient Medications:  .  atenolol (TENORMIN) 25 MG tablet, Take 1 tablet (25 mg total) by mouth daily., Disp: 90 tablet, Rfl: 3 .  Cholecalciferol 125 MCG (5000 UT) capsule, Take 5,000 Units by mouth daily., Disp: , Rfl:  .  levothyroxine (SYNTHROID) 100 MCG tablet, Take 1 tablet (100 mcg total) by mouth daily before breakfast., Disp: 90 tablet, Rfl: 3 .  OVER THE COUNTER MEDICATION, Centrum Silver-Take 1 tablet by mouth daily., Disp: , Rfl:    Objective:   Vitals:   04/19/19 1413  BP: 134/78  Pulse: 77  Temp: 98.3 F (36.8 C)  SpO2: 98%    Physical Exam Vitals signs and nursing note reviewed.  Constitutional:      Appearance: Normal appearance.  HENT:     Head: Normocephalic and atraumatic.  Cardiovascular:     Rate and Rhythm: Normal rate.  Pulmonary:     Effort: Pulmonary effort is normal.   Abdominal:     General: Abdomen is flat. There is no distension.     Tenderness: There is no abdominal tenderness.  Neurological:     General: No focal deficit present.     Mental Status: She is oriented to person, place, and time.  Psychiatric:        Mood and Affect: Mood normal.        Behavior: Behavior normal.        Thought Content: Thought content normal.        Judgment: Judgment normal.     Assessment & Plan:  Symptomatic mammary hypertrophy  Ductal papilloma, R breast  The risk that can be encountered with breast reduction were discussed and include the following but not limited to these:  Breast asymmetry, fluid accumulation, firmness of the breast, inability to breast feed, loss of nipple or areola, skin loss, decrease or no nipple sensation, fat necrosis of the breast tissue, bleeding, infection, healing delay.  There are risks of anesthesia, changes to skin sensation and injury to nerves or blood vessels.  The muscle can be temporarily or permanently injured.  You may have an allergic reaction to tape, suture, glue, blood products which can result in skin discoloration, swelling, pain, skin lesions, poor healing.  Any of these can lead to the need for revisonal surgery or stage procedures.  A reduction has potential to interfere with diagnostic procedures.  Nipple or breast piercing can increase risks of infection.  This procedure is best done when the breast is fully developed.  Changes in the breast will continue to occur over time.  Pregnancy can alter the outcomes of previous breast reduction surgery, weight gain and weigh loss can also effect the long term appearance.   We are planning on bilateral breast reduction in coordination with Dr. Marlou Starks. Prescription sent to pharmacy.  [place], DO

## 2019-04-19 NOTE — Progress Notes (Signed)
Patient ID: Monica Spencer, female    DOB: 1971/02/24, 48 y.o.   MRN: [id number]   Chief Complaint  Patient presents with  . Breast Problem    The patient is a 48 year old female here for her history and physical.  The patient was diagnosed with severe papillary disease of the right breast.  She is 5 feet 5 inches tall and weighs 257 pounds her bra size is a 40K.  We are planning to remove 1100 g from each breast.  We will work in coordination with Dr. Marlou Starks for removal of the breast disease.   Review of Systems  Constitutional: Negative.  Negative for activity change.  HENT: Negative.   Eyes: Negative.   Respiratory: Negative.   Cardiovascular: Negative.   Gastrointestinal: Negative.   Endocrine: Negative.   Genitourinary: Negative.   Musculoskeletal: Positive for back pain and neck pain.  Neurological: Negative.   Hematological: Negative.   Psychiatric/Behavioral: Negative.     Past Medical History:  Diagnosis Date  . Hypertension   . Thyroid disease     Past Surgical History:  Procedure Laterality Date  . THYROIDECTOMY        Current Outpatient Medications:  .  atenolol (TENORMIN) 25 MG tablet, Take 1 tablet (25 mg total) by mouth daily., Disp: 90 tablet, Rfl: 3 .  Cholecalciferol 125 MCG (5000 UT) capsule, Take 5,000 Units by mouth daily., Disp: , Rfl:  .  levothyroxine (SYNTHROID) 100 MCG tablet, Take 1 tablet (100 mcg total) by mouth daily before breakfast., Disp: 90 tablet, Rfl: 3 .  OVER THE COUNTER MEDICATION, Centrum Silver-Take 1 tablet by mouth daily., Disp: , Rfl:    Objective:   Vitals:   04/19/19 1413  BP: 134/78  Pulse: 77  Temp: 98.3 F (36.8 C)  SpO2: 98%    Physical Exam Vitals signs and nursing note reviewed.  Constitutional:      Appearance: Normal appearance.  HENT:     Head: Normocephalic and atraumatic.  Cardiovascular:     Rate and Rhythm: Normal rate.  Pulmonary:     Effort: Pulmonary effort is normal.   Abdominal:     General: Abdomen is flat. There is no distension.     Tenderness: There is no abdominal tenderness.  Neurological:     General: No focal deficit present.     Mental Status: She is oriented to person, place, and time.  Psychiatric:        Mood and Affect: Mood normal.        Behavior: Behavior normal.        Thought Content: Thought content normal.        Judgment: Judgment normal.     Assessment & Plan:  Symptomatic mammary hypertrophy  Ductal papilloma, R breast  The risk that can be encountered with breast reduction were discussed and include the following but not limited to these:  Breast asymmetry, fluid accumulation, firmness of the breast, inability to breast feed, loss of nipple or areola, skin loss, decrease or no nipple sensation, fat necrosis of the breast tissue, bleeding, infection, healing delay.  There are risks of anesthesia, changes to skin sensation and injury to nerves or blood vessels.  The muscle can be temporarily or permanently injured.  You may have an allergic reaction to tape, suture, glue, blood products which can result in skin discoloration, swelling, pain, skin lesions, poor healing.  Any of these can lead to the need for revisonal surgery or stage procedures.  A reduction has potential to interfere with diagnostic procedures.  Nipple or breast piercing can increase risks of infection.  This procedure is best done when the breast is fully developed.  Changes in the breast will continue to occur over time.  Pregnancy can alter the outcomes of previous breast reduction surgery, weight gain and weigh loss can also effect the long term appearance.   We are planning on bilateral breast reduction in coordination with Dr. Marlou Starks. Prescription sent to pharmacy.  [place], DO

## 2019-04-23 ENCOUNTER — Encounter (HOSPITAL_BASED_OUTPATIENT_CLINIC_OR_DEPARTMENT_OTHER): Payer: Self-pay | Admitting: *Deleted

## 2019-04-23 ENCOUNTER — Other Ambulatory Visit: Payer: Self-pay

## 2019-04-26 ENCOUNTER — Other Ambulatory Visit (HOSPITAL_COMMUNITY): Payer: BC Managed Care – PPO

## 2019-04-27 ENCOUNTER — Other Ambulatory Visit (HOSPITAL_COMMUNITY)
Admission: RE | Admit: 2019-04-27 | Discharge: 2019-04-27 | Disposition: A | Discharge status: Home / Self Care | Payer: BC Managed Care – PPO | Source: Ambulatory Visit | Attending: General Surgery | Admitting: General Surgery

## 2019-04-27 DIAGNOSIS — Z01812 Encounter for preprocedural laboratory examination: Secondary | ICD-10-CM | POA: Insufficient documentation

## 2019-04-27 DIAGNOSIS — Z20828 Contact with and (suspected) exposure to other viral communicable diseases: Secondary | ICD-10-CM | POA: Insufficient documentation

## 2019-04-28 LAB — NOVEL CORONAVIRUS, NAA (HOSP ORDER, SEND-OUT TO REF LAB; TAT 18-24 HRS): SARS-CoV-2, NAA: NOT DETECTED

## 2019-04-30 ENCOUNTER — Ambulatory Visit
Admission: RE | Admit: 2019-04-30 | Discharge: 2019-04-30 | Disposition: A | Discharge status: Home / Self Care | Payer: BC Managed Care – PPO | Source: Ambulatory Visit | Attending: General Surgery | Admitting: General Surgery

## 2019-04-30 ENCOUNTER — Other Ambulatory Visit: Payer: Self-pay

## 2019-04-30 ENCOUNTER — Encounter (HOSPITAL_BASED_OUTPATIENT_CLINIC_OR_DEPARTMENT_OTHER)
Admission: RE | Admit: 2019-04-30 | Discharge: 2019-04-30 | Disposition: A | Discharge status: Home / Self Care | Payer: BC Managed Care – PPO | Source: Ambulatory Visit | Attending: General Surgery | Admitting: General Surgery

## 2019-04-30 DIAGNOSIS — D241 Benign neoplasm of right breast: Secondary | ICD-10-CM | POA: Diagnosis not present

## 2019-04-30 DIAGNOSIS — Z01818 Encounter for other preprocedural examination: Secondary | ICD-10-CM | POA: Insufficient documentation

## 2019-04-30 DIAGNOSIS — D369 Benign neoplasm, unspecified site: Secondary | ICD-10-CM

## 2019-04-30 DIAGNOSIS — R921 Mammographic calcification found on diagnostic imaging of breast: Secondary | ICD-10-CM | POA: Diagnosis not present

## 2019-04-30 LAB — POCT PREGNANCY, URINE: Preg Test, Ur: NEGATIVE

## 2019-04-30 NOTE — Progress Notes (Signed)
      Enhanced Recovery after Surgery  Enhanced Recovery after Surgery is a protocol used to improve the stress on your body and your recovery after surgery.  Patient Instructions  . The night before surgery:  o No food after midnight. ONLY clear liquids after midnight  . The day of surgery (if you do NOT have diabetes):  o Drink ONE (1) Pre-Surgery Clear Ensure as directed.   o This drink was given to you during your hospital  pre-op appointment visit. o The pre-op nurse will instruct you on the time to drink the  Pre-Surgery Ensure depending on your surgery time. o Finish the drink at the designated time by the pre-op nurse.  o Nothing else to drink after completing the  Pre-Surgery Clear Ensure.  . The day of surgery (if you have diabetes): o Drink ONE (1) Gatorade 2 (G2) as directed. o This drink was given to you during your hospital  pre-op appointment visit.  o The pre-op nurse will instruct you on the time to drink the   Gatorade 2 (G2) depending on your surgery time. o Color of the Gatorade may vary. Red is not allowed. o Nothing else to drink after completing the  Gatorade 2 (G2).         If you have questions, please contact your surgeon's office.  Surgical soap also given to patient with instructions for use.  Patient verbalized understanding of instructions. 

## 2019-05-01 ENCOUNTER — Ambulatory Visit
Admission: RE | Admit: 2019-05-01 | Discharge: 2019-05-01 | Disposition: A | Discharge status: Home / Self Care | Payer: BC Managed Care – PPO | Source: Ambulatory Visit | Attending: General Surgery | Admitting: General Surgery

## 2019-05-01 ENCOUNTER — Encounter (HOSPITAL_BASED_OUTPATIENT_CLINIC_OR_DEPARTMENT_OTHER): Payer: Self-pay | Admitting: *Deleted

## 2019-05-01 ENCOUNTER — Encounter (HOSPITAL_BASED_OUTPATIENT_CLINIC_OR_DEPARTMENT_OTHER)
Admission: RE | Disposition: A | Discharge status: Home / Self Care | Payer: Self-pay | Source: Home / Self Care | Attending: General Surgery

## 2019-05-01 ENCOUNTER — Ambulatory Visit (HOSPITAL_BASED_OUTPATIENT_CLINIC_OR_DEPARTMENT_OTHER): Payer: BC Managed Care – PPO | Admitting: Certified Registered"

## 2019-05-01 ENCOUNTER — Ambulatory Visit (HOSPITAL_BASED_OUTPATIENT_CLINIC_OR_DEPARTMENT_OTHER)
Admission: RE | Admit: 2019-05-01 | Discharge: 2019-05-01 | Disposition: A | Discharge status: Home / Self Care | Payer: BC Managed Care – PPO | Attending: General Surgery | Admitting: General Surgery

## 2019-05-01 DIAGNOSIS — D242 Benign neoplasm of left breast: Secondary | ICD-10-CM | POA: Insufficient documentation

## 2019-05-01 DIAGNOSIS — E039 Hypothyroidism, unspecified: Secondary | ICD-10-CM | POA: Insufficient documentation

## 2019-05-01 DIAGNOSIS — I10 Essential (primary) hypertension: Secondary | ICD-10-CM | POA: Insufficient documentation

## 2019-05-01 DIAGNOSIS — Z803 Family history of malignant neoplasm of breast: Secondary | ICD-10-CM | POA: Diagnosis not present

## 2019-05-01 DIAGNOSIS — E782 Mixed hyperlipidemia: Secondary | ICD-10-CM | POA: Diagnosis not present

## 2019-05-01 DIAGNOSIS — Z79890 Hormone replacement therapy (postmenopausal): Secondary | ICD-10-CM | POA: Insufficient documentation

## 2019-05-01 DIAGNOSIS — D369 Benign neoplasm, unspecified site: Secondary | ICD-10-CM

## 2019-05-01 DIAGNOSIS — R928 Other abnormal and inconclusive findings on diagnostic imaging of breast: Secondary | ICD-10-CM | POA: Diagnosis not present

## 2019-05-01 DIAGNOSIS — M549 Dorsalgia, unspecified: Secondary | ICD-10-CM | POA: Diagnosis not present

## 2019-05-01 DIAGNOSIS — D241 Benign neoplasm of right breast: Secondary | ICD-10-CM | POA: Insufficient documentation

## 2019-05-01 DIAGNOSIS — M542 Cervicalgia: Secondary | ICD-10-CM | POA: Insufficient documentation

## 2019-05-01 DIAGNOSIS — N62 Hypertrophy of breast: Secondary | ICD-10-CM | POA: Diagnosis not present

## 2019-05-01 DIAGNOSIS — N6081 Other benign mammary dysplasias of right breast: Secondary | ICD-10-CM | POA: Diagnosis not present

## 2019-05-01 DIAGNOSIS — N6011 Diffuse cystic mastopathy of right breast: Secondary | ICD-10-CM | POA: Diagnosis not present

## 2019-05-01 HISTORY — PX: BREAST REDUCTION SURGERY: SHX8

## 2019-05-01 HISTORY — PX: BREAST LUMPECTOMY WITH RADIOACTIVE SEED LOCALIZATION: SHX6424

## 2019-05-01 SURGERY — BREAST LUMPECTOMY WITH RADIOACTIVE SEED LOCALIZATION
Anesthesia: General | Site: Breast | Laterality: Right

## 2019-05-01 MED ORDER — CEFAZOLIN SODIUM-DEXTROSE 2-4 GM/100ML-% IV SOLN: 2.0000 g | INTRAVENOUS | Status: DC

## 2019-05-01 MED ORDER — PROPOFOL 500 MG/50ML IV EMUL
INTRAVENOUS | Status: DC | PRN
  Administered 2019-05-01: 25 ug/kg/min via INTRAVENOUS

## 2019-05-01 MED ORDER — ROCURONIUM BROMIDE 10 MG/ML (PF) SYRINGE
PREFILLED_SYRINGE | INTRAVENOUS | Status: AC
  Filled 2019-05-01: qty 20

## 2019-05-01 MED ORDER — PROPOFOL 10 MG/ML IV BOLUS
INTRAVENOUS | Status: DC | PRN
  Administered 2019-05-01: 200 mg via INTRAVENOUS

## 2019-05-01 MED ORDER — CEFAZOLIN SODIUM-DEXTROSE 2-3 GM-%(50ML) IV SOLR
INTRAVENOUS | Status: DC | PRN
  Administered 2019-05-01 (×2): 2 g via INTRAVENOUS

## 2019-05-01 MED ORDER — FENTANYL CITRATE (PF) 100 MCG/2ML IJ SOLN
INTRAMUSCULAR | Status: AC
  Filled 2019-05-01: qty 2

## 2019-05-01 MED ORDER — CELECOXIB 200 MG PO CAPS
ORAL_CAPSULE | ORAL | Status: AC
  Filled 2019-05-01: qty 1

## 2019-05-01 MED ORDER — CHLORHEXIDINE GLUCONATE CLOTH 2 % EX PADS: 6.0000 | MEDICATED_PAD | Freq: Once | CUTANEOUS | Status: DC

## 2019-05-01 MED ORDER — ONDANSETRON HCL 4 MG/2ML IJ SOLN: 4.0000 mg | Freq: Once | INTRAMUSCULAR | Status: DC | PRN

## 2019-05-01 MED ORDER — PHENYLEPHRINE HCL (PRESSORS) 10 MG/ML IV SOLN
INTRAVENOUS | Status: DC | PRN
  Administered 2019-05-01: 80 ug via INTRAVENOUS
  Administered 2019-05-01: 40 ug via INTRAVENOUS
  Administered 2019-05-01: 120 ug via INTRAVENOUS
  Administered 2019-05-01 (×2): 40 ug via INTRAVENOUS

## 2019-05-01 MED ORDER — SUGAMMADEX SODIUM 500 MG/5ML IV SOLN
INTRAVENOUS | Status: DC | PRN
  Administered 2019-05-01: 300 mg via INTRAVENOUS

## 2019-05-01 MED ORDER — MIDAZOLAM HCL 2 MG/2ML IJ SOLN
1.0000 mg | INTRAMUSCULAR | Status: DC | PRN
  Administered 2019-05-01: 2 mg via INTRAVENOUS

## 2019-05-01 MED ORDER — PROPOFOL 500 MG/50ML IV EMUL
INTRAVENOUS | Status: AC
  Filled 2019-05-01: qty 100

## 2019-05-01 MED ORDER — OXYCODONE HCL 5 MG PO TABS: 5.0000 mg | ORAL_TABLET | Freq: Once | ORAL | Status: DC | PRN

## 2019-05-01 MED ORDER — PROPOFOL 500 MG/50ML IV EMUL
INTRAVENOUS | Status: AC
  Filled 2019-05-01: qty 50

## 2019-05-01 MED ORDER — DEXAMETHASONE SODIUM PHOSPHATE 10 MG/ML IJ SOLN
INTRAMUSCULAR | Status: AC
  Filled 2019-05-01: qty 2

## 2019-05-01 MED ORDER — SODIUM CHLORIDE (PF) 0.9 % IJ SOLN
INTRAMUSCULAR | Status: AC
  Filled 2019-05-01: qty 10

## 2019-05-01 MED ORDER — CEFAZOLIN SODIUM-DEXTROSE 2-4 GM/100ML-% IV SOLN
INTRAVENOUS | Status: AC
  Filled 2019-05-01: qty 100

## 2019-05-01 MED ORDER — SUGAMMADEX SODIUM 500 MG/5ML IV SOLN
INTRAVENOUS | Status: AC
  Filled 2019-05-01: qty 5

## 2019-05-01 MED ORDER — OXYCODONE HCL 5 MG/5ML PO SOLN: 5.0000 mg | Freq: Once | ORAL | Status: DC | PRN

## 2019-05-01 MED ORDER — LIDOCAINE-EPINEPHRINE 1 %-1:100000 IJ SOLN
INTRAMUSCULAR | Status: AC
  Filled 2019-05-01: qty 1

## 2019-05-01 MED ORDER — DEXAMETHASONE SODIUM PHOSPHATE 4 MG/ML IJ SOLN
INTRAMUSCULAR | Status: DC | PRN
  Administered 2019-05-01: 10 mg via INTRAVENOUS

## 2019-05-01 MED ORDER — LIDOCAINE HCL 1 % IJ SOLN
INTRAMUSCULAR | Status: DC | PRN
  Administered 2019-05-01: 40 mL

## 2019-05-01 MED ORDER — ACETAMINOPHEN 500 MG PO TABS
1000.0000 mg | ORAL_TABLET | ORAL | Status: AC
  Administered 2019-05-01: 1000 mg via ORAL

## 2019-05-01 MED ORDER — ONDANSETRON HCL 4 MG/2ML IJ SOLN
INTRAMUSCULAR | Status: DC | PRN
  Administered 2019-05-01: 4 mg via INTRAVENOUS

## 2019-05-01 MED ORDER — ACETAMINOPHEN 325 MG PO TABS: 325.0000 mg | ORAL_TABLET | ORAL | Status: DC | PRN

## 2019-05-01 MED ORDER — GABAPENTIN 300 MG PO CAPS
300.0000 mg | ORAL_CAPSULE | ORAL | Status: AC
  Administered 2019-05-01: 300 mg via ORAL

## 2019-05-01 MED ORDER — ACETAMINOPHEN 160 MG/5ML PO SOLN: 325.0000 mg | ORAL | Status: DC | PRN

## 2019-05-01 MED ORDER — EPHEDRINE 5 MG/ML INJ
INTRAVENOUS | Status: AC
  Filled 2019-05-01: qty 30

## 2019-05-01 MED ORDER — ROCURONIUM BROMIDE 100 MG/10ML IV SOLN
INTRAVENOUS | Status: DC | PRN
  Administered 2019-05-01: 50 mg via INTRAVENOUS
  Administered 2019-05-01: 20 mg via INTRAVENOUS
  Administered 2019-05-01 (×2): 10 mg via INTRAVENOUS

## 2019-05-01 MED ORDER — FENTANYL CITRATE (PF) 100 MCG/2ML IJ SOLN: 50.0000 ug | INTRAMUSCULAR | Status: DC | PRN

## 2019-05-01 MED ORDER — BUPIVACAINE HCL (PF) 0.25 % IJ SOLN
INTRAMUSCULAR | Status: AC
  Filled 2019-05-01: qty 30

## 2019-05-01 MED ORDER — PHENYLEPHRINE 40 MCG/ML (10ML) SYRINGE FOR IV PUSH (FOR BLOOD PRESSURE SUPPORT)
PREFILLED_SYRINGE | INTRAVENOUS | Status: AC
  Filled 2019-05-01: qty 40

## 2019-05-01 MED ORDER — ACETAMINOPHEN 500 MG PO TABS
ORAL_TABLET | ORAL | Status: AC
  Filled 2019-05-01: qty 2

## 2019-05-01 MED ORDER — MIDAZOLAM HCL 2 MG/2ML IJ SOLN
INTRAMUSCULAR | Status: AC
  Filled 2019-05-01: qty 2

## 2019-05-01 MED ORDER — BUPIVACAINE-EPINEPHRINE (PF) 0.25% -1:200000 IJ SOLN
INTRAMUSCULAR | Status: AC
  Filled 2019-05-01: qty 30

## 2019-05-01 MED ORDER — EPHEDRINE SULFATE 50 MG/ML IJ SOLN
INTRAMUSCULAR | Status: DC | PRN
  Administered 2019-05-01 (×2): 10 mg via INTRAVENOUS

## 2019-05-01 MED ORDER — SODIUM CHLORIDE 0.9 % IV SOLN
INTRAVENOUS | Status: AC
  Filled 2019-05-01: qty 500000

## 2019-05-01 MED ORDER — FENTANYL CITRATE (PF) 100 MCG/2ML IJ SOLN
25.0000 ug | INTRAMUSCULAR | Status: DC | PRN
  Administered 2019-05-01: 10:00:00 100 ug via INTRAVENOUS
  Administered 2019-05-01 (×2): 25 ug via INTRAVENOUS
  Administered 2019-05-01 (×2): 50 ug via INTRAVENOUS

## 2019-05-01 MED ORDER — LIDOCAINE 2% (20 MG/ML) 5 ML SYRINGE
INTRAMUSCULAR | Status: AC
  Filled 2019-05-01: qty 10

## 2019-05-01 MED ORDER — GABAPENTIN 300 MG PO CAPS
ORAL_CAPSULE | ORAL | Status: AC
  Filled 2019-05-01: qty 1

## 2019-05-01 MED ORDER — SODIUM CHLORIDE 0.9 % IV SOLN
INTRAVENOUS | Status: DC | PRN
  Administered 2019-05-01: 500 mL

## 2019-05-01 MED ORDER — LIDOCAINE HCL (CARDIAC) PF 100 MG/5ML IV SOSY
PREFILLED_SYRINGE | INTRAVENOUS | Status: DC | PRN
  Administered 2019-05-01: 60 mg via INTRAVENOUS

## 2019-05-01 MED ORDER — LACTATED RINGERS IV SOLN
INTRAVENOUS | Status: DC
  Administered 2019-05-01 (×3): via INTRAVENOUS

## 2019-05-01 MED ORDER — ONDANSETRON HCL 4 MG/2ML IJ SOLN
INTRAMUSCULAR | Status: AC
  Filled 2019-05-01: qty 8

## 2019-05-01 MED ORDER — MEPERIDINE HCL 25 MG/ML IJ SOLN: 6.2500 mg | INTRAMUSCULAR | Status: DC | PRN

## 2019-05-01 MED ORDER — CELECOXIB 200 MG PO CAPS
200.0000 mg | ORAL_CAPSULE | ORAL | Status: AC
  Administered 2019-05-01: 200 mg via ORAL

## 2019-05-01 SURGICAL SUPPLY — 84 items
APPLIER CLIP 9.375 MED OPEN (MISCELLANEOUS)
BAG DECANTER FOR FLEXI CONT (MISCELLANEOUS) ×3 IMPLANT
BINDER BREAST 3XL (GAUZE/BANDAGES/DRESSINGS) ×1 IMPLANT
BINDER BREAST LRG (GAUZE/BANDAGES/DRESSINGS) IMPLANT
BINDER BREAST MEDIUM (GAUZE/BANDAGES/DRESSINGS) IMPLANT
BINDER BREAST XLRG (GAUZE/BANDAGES/DRESSINGS) IMPLANT
BINDER BREAST XXLRG (GAUZE/BANDAGES/DRESSINGS) IMPLANT
BIOPATCH RED 1 DISK 7.0 (GAUZE/BANDAGES/DRESSINGS) ×2 IMPLANT
BLADE HEX COATED 2.75 (ELECTRODE) ×3 IMPLANT
BLADE KNIFE PERSONA 10 (BLADE) ×7 IMPLANT
BLADE SURG 15 STRL LF DISP TIS (BLADE) ×2 IMPLANT
BLADE SURG 15 STRL SS (BLADE) ×2
CANISTER SUC SOCK COL 7IN (MISCELLANEOUS) ×2 IMPLANT
CANISTER SUCT 1200ML W/VALVE (MISCELLANEOUS) ×3 IMPLANT
CHLORAPREP W/TINT 26 (MISCELLANEOUS) ×5 IMPLANT
CLIP APPLIE 9.375 MED OPEN (MISCELLANEOUS) IMPLANT
COVER BACK TABLE REUSABLE LG (DRAPES) ×3 IMPLANT
COVER MAYO STAND REUSABLE (DRAPES) ×3 IMPLANT
COVER PROBE W GEL 5X96 (DRAPES) ×3 IMPLANT
COVER WAND RF STERILE (DRAPES) IMPLANT
DECANTER SPIKE VIAL GLASS SM (MISCELLANEOUS) IMPLANT
DERMABOND ADVANCED (GAUZE/BANDAGES/DRESSINGS) ×6
DERMABOND ADVANCED .7 DNX12 (GAUZE/BANDAGES/DRESSINGS) ×2 IMPLANT
DRAIN CHANNEL 19F RND (DRAIN) ×2 IMPLANT
DRAPE HALF SHEET 40X57 (DRAPES) ×2 IMPLANT
DRAPE LAPAROSCOPIC ABDOMINAL (DRAPES) ×3 IMPLANT
DRAPE UTILITY XL STRL (DRAPES) ×4 IMPLANT
DRSG PAD ABDOMINAL 8X10 ST (GAUZE/BANDAGES/DRESSINGS) ×8 IMPLANT
ELECT BLADE 4.0 EZ CLEAN MEGAD (MISCELLANEOUS) ×3
ELECT COATED BLADE 2.86 ST (ELECTRODE) ×3 IMPLANT
ELECT REM PT RETURN 9FT ADLT (ELECTROSURGICAL) ×3
ELECTRODE BLDE 4.0 EZ CLN MEGD (MISCELLANEOUS) IMPLANT
ELECTRODE REM PT RTRN 9FT ADLT (ELECTROSURGICAL) ×2 IMPLANT
EVACUATOR SILICONE 100CC (DRAIN) ×2 IMPLANT
GAUZE SPONGE 4X4 12PLY STRL (GAUZE/BANDAGES/DRESSINGS) ×1 IMPLANT
GAUZE SPONGE 4X4 12PLY STRL LF (GAUZE/BANDAGES/DRESSINGS) IMPLANT
GLOVE BIO SURGEON STRL SZ 6.5 (GLOVE) ×10 IMPLANT
GLOVE BIO SURGEON STRL SZ7 (GLOVE) ×3 IMPLANT
GLOVE BIO SURGEON STRL SZ7.5 (GLOVE) ×5 IMPLANT
GLOVE ECLIPSE 6.5 STRL STRAW (GLOVE) ×2 IMPLANT
GLOVE EXAM NITRILE MD LF STRL (GLOVE) ×1 IMPLANT
GLOVE SURG SS PI 6.5 STRL IVOR (GLOVE) ×1 IMPLANT
GOWN STRL REUS W/ TWL LRG LVL3 (GOWN DISPOSABLE) ×6 IMPLANT
GOWN STRL REUS W/ TWL XL LVL3 (GOWN DISPOSABLE) IMPLANT
GOWN STRL REUS W/TWL LRG LVL3 (GOWN DISPOSABLE) ×5
GOWN STRL REUS W/TWL XL LVL3 (GOWN DISPOSABLE) ×1
ILLUMINATOR WAVEGUIDE N/F (MISCELLANEOUS) IMPLANT
KIT MARKER MARGIN INK (KITS) ×2 IMPLANT
LIGHT WAVEGUIDE WIDE FLAT (MISCELLANEOUS) IMPLANT
NDL HYPO 25X1 1.5 SAFETY (NEEDLE) ×2 IMPLANT
NDL SAFETY ECLIPSE 18X1.5 (NEEDLE) IMPLANT
NEEDLE HYPO 18GX1.5 SHARP (NEEDLE)
NEEDLE HYPO 25X1 1.5 SAFETY (NEEDLE) ×3 IMPLANT
NS IRRIG 1000ML POUR BTL (IV SOLUTION) IMPLANT
PACK BASIN DAY SURGERY FS (CUSTOM PROCEDURE TRAY) ×3 IMPLANT
PAD ALCOHOL SWAB (MISCELLANEOUS) IMPLANT
PENCIL BUTTON HOLSTER BLD 10FT (ELECTRODE) ×3 IMPLANT
PIN SAFETY STERILE (MISCELLANEOUS) ×1 IMPLANT
SLEEVE SCD COMPRESS KNEE MED (MISCELLANEOUS) ×3 IMPLANT
SPONGE LAP 18X18 RF (DISPOSABLE) ×12 IMPLANT
STRIP SUTURE WOUND CLOSURE 1/2 (SUTURE) ×8 IMPLANT
SUT MNCRL AB 4-0 PS2 18 (SUTURE) ×20 IMPLANT
SUT MON AB 3-0 SH 27 (SUTURE) ×6
SUT MON AB 3-0 SH27 (SUTURE) ×6 IMPLANT
SUT MON AB 4-0 PC3 18 (SUTURE) ×2 IMPLANT
SUT MON AB 5-0 PS2 18 (SUTURE) ×17 IMPLANT
SUT PDS 3-0 CT2 (SUTURE)
SUT PDS AB 2-0 CT2 27 (SUTURE) IMPLANT
SUT PDS II 3-0 CT2 27 ABS (SUTURE) IMPLANT
SUT SILK 2 0 SH (SUTURE) ×1 IMPLANT
SUT SILK 3 0 PS 1 (SUTURE) ×2 IMPLANT
SUT VICRYL 3-0 CR8 SH (SUTURE) ×2 IMPLANT
SYR 3ML 23GX1 SAFETY (SYRINGE) IMPLANT
SYR 50ML LL SCALE MARK (SYRINGE) IMPLANT
SYR BULB IRRIGATION 50ML (SYRINGE) ×3 IMPLANT
SYR CONTROL 10ML LL (SYRINGE) ×3 IMPLANT
TAPE MEASURE VINYL STERILE (MISCELLANEOUS) ×1 IMPLANT
TOWEL GREEN STERILE FF (TOWEL DISPOSABLE) ×6 IMPLANT
TRAY FAXITRON CT DISP (TRAY / TRAY PROCEDURE) ×3 IMPLANT
TUBE CONNECTING 20X1/4 (TUBING) ×3 IMPLANT
TUBING INFILTRATION IT-10001 (TUBING) IMPLANT
TUBING SET GRADUATE ASPIR 12FT (MISCELLANEOUS) IMPLANT
UNDERPAD 30X36 HEAVY ABSORB (UNDERPADS AND DIAPERS) ×6 IMPLANT
YANKAUER SUCT BULB TIP NO VENT (SUCTIONS) ×3 IMPLANT

## 2019-05-01 NOTE — Op Note (Signed)
05/01/2019  11:20 AM  PATIENT:  Monica Spencer  48 y.o. female  PRE-OPERATIVE DIAGNOSIS:  RIGHT BREAST PAPILLOMA  POST-OPERATIVE DIAGNOSIS:  RIGHT BREAST PAPILLOMA  PROCEDURE:  Procedure(s): RIGHT BREAST LUMPECTOMY WITH RADIOACTIVE SEED LOCALIZATION X 2 (Right)  SURGEON:  Surgeon(s) and Role: Panel 1:    Jovita Kussmaul, MD - Primary Panel 2:    * Dillingham, Loel Lofty, DO - Primary  PHYSICIAN ASSISTANT:   ASSISTANTS: Dr. Marla Roe   ANESTHESIA:   general  EBL:  minimal   BLOOD ADMINISTERED:none  DRAINS: none   LOCAL MEDICATIONS USED:  MARCAINE     SPECIMEN:  Source of Specimen:  right breast tissue  DISPOSITION OF SPECIMEN:  PATHOLOGY  COUNTS:  YES  TOURNIQUET:  * No tourniquets in log *  DICTATION: .Dragon Dictation   After informed consent was obtained the patient was brought to the operating room and placed in the supine position on the operating table.  After adequate induction of general anesthesia the patient's bilateral chest, breast, and axillary areas were prepped with ChloraPrep, allowed to dry, and draped in usual sterile manner.  An appropriate timeout was performed.  Previously to I-125 seeds were placed in the upper portion of the right breast to mark areas of intraductal papilloma.  The neoprobe was set to I-125 in the areas of radioactivity were readily identified.  These areas corresponded to the area that was to be removed from the superior pole of the breast for a reduction which will be performed by Dr. Marla Roe during this operation.  The areas for the reduction incisions were marked by Dr. Marla Roe.  At this point I was able to cut along the incisions along the upper breast with a 15 blade knife.  The incision was carried through the skin and subcutaneous tissue sharply with the electrocautery.  The superior pole breast tissue was removed sharply with the electrocautery around the radioactive seeds while checking the area of  radioactivity frequently.  Once the specimen was removed it was oriented with a short stitch on the superior surface and a long stitch on the lateral surface and the skin was anterior.  A specimen radiograph was obtained that showed the clips and seeds both to be within the specimen.  The specimen was then sent to pathology for further evaluation.  At this point the operation was turned over to Dr. Marla Roe for the reduction.  The patient had tolerated the operation well so far.  All needle sponge and instrument counts were correct.  The patient was in stable condition.  PLAN OF CARE: Discharge to home after PACU  PATIENT DISPOSITION:  PACU - hemodynamically stable.   Delay start of Pharmacological VTE agent (>24hrs) due to surgical blood loss or risk of bleeding: not applicable

## 2019-05-01 NOTE — Interval H&P Note (Signed)
History and Physical Interval Note:  05/01/2019 9:40 AM  Monica Spencer  has presented today for surgery, with the diagnosis of RIGHT BREAST PAPILLOMA.  The various methods of treatment have been discussed with the patient and family. After consideration of risks, benefits and other options for treatment, the patient has consented to  Procedure(s): RIGHT BREAST LUMPECTOMY WITH RADIOACTIVE SEED LOCALIZATION X 2 (Right) MAMMARY REDUCTION  (BREAST) (Bilateral) as a surgical intervention.  The patient's history has been reviewed, patient examined, no change in status, stable for surgery.  I have reviewed the patient's chart and labs.  Questions were answered to the patient's satisfaction.     Autumn Messing III

## 2019-05-01 NOTE — Anesthesia Procedure Notes (Signed)
Procedure Name: Intubation Date/Time: 05/01/2019 10:16 AM Performed by: Signe Colt, CRNA Pre-anesthesia Checklist: Patient identified, Emergency Drugs available, Suction available and Patient being monitored Patient Re-evaluated:Patient Re-evaluated prior to induction Oxygen Delivery Method: Circle system utilized Preoxygenation: Pre-oxygenation with 100% oxygen Induction Type: IV induction Ventilation: Mask ventilation without difficulty Laryngoscope Size: Mac and 3 Tube type: Oral Tube size: 7.0 mm Number of attempts: 1 Airway Equipment and Method: Stylet and Oral airway Placement Confirmation: ETT inserted through vocal cords under direct vision,  positive ETCO2 and breath sounds checked- equal and bilateral Secured at: 21 cm Tube secured with: Tape Dental Injury: Teeth and Oropharynx as per pre-operative assessment

## 2019-05-01 NOTE — Anesthesia Preprocedure Evaluation (Addendum)
Anesthesia Evaluation  Patient identified by MRN, date of birth, ID band Patient awake    Reviewed: Allergy & Precautions, H&P , NPO status , Patient's Chart, lab work & pertinent test results, reviewed documented beta blocker date and time   Airway Mallampati: I  TM Distance: >3 FB Neck ROM: full    Dental no notable dental hx. (+) Teeth Intact, Dental Advisory Given   Pulmonary neg pulmonary ROS,    Pulmonary exam normal breath sounds clear to auscultation       Cardiovascular Exercise Tolerance: Good hypertension, Pt. on medications and Pt. on home beta blockers negative cardio ROS   Rhythm:regular Rate:Normal     Neuro/Psych negative neurological ROS  negative psych ROS   GI/Hepatic negative GI ROS, Neg liver ROS,   Endo/Other  Hypothyroidism   Renal/GU negative Renal ROS  negative genitourinary   Musculoskeletal   Abdominal (+) + obese,   Peds  Hematology negative hematology ROS (+)   Anesthesia Other Findings   Reproductive/Obstetrics negative OB ROS                            Anesthesia Physical Anesthesia Plan  ASA: III  Anesthesia Plan: General   Post-op Pain Management:    Induction: Intravenous  PONV Risk Score and Plan: 3 and Ondansetron, Treatment may vary due to age or medical condition, Dexamethasone and Midazolam  Airway Management Planned: Oral ETT and LMA  Additional Equipment:   Intra-op Plan:   Post-operative Plan: Extubation in OR  Informed Consent: I have reviewed the patients History and Physical, chart, labs and discussed the procedure including the risks, benefits and alternatives for the proposed anesthesia with the patient or authorized representative who has indicated his/her understanding and acceptance.     Dental Advisory Given  Plan Discussed with: CRNA, Anesthesiologist and Surgeon  Anesthesia Plan Comments: (  )         Anesthesia Quick Evaluation

## 2019-05-01 NOTE — H&P (Signed)
Monica Spencer  Location: [facility] Surgery Patient #: [id number] DOB: 03-22-1971 Married / Language: English / Race: Black or African American Female   History of Present Illness  The patient is a 48 year old female who presents with a breast mass. We are asked to see the patient in consultation by Dr. Royston Sinner to evaluate her for intraductal papillomas of the right breast. The patient is a 48 year old black female who recently developed bloody nipple discharge from the right nipple. This happened several times over a period of a couple weeks. The discharge was spontaneous. She denies any breast pain. She does have a family history of breast cancer in a grandmother and no significant family history in multiple relatives for colon cancer. She does not smoke.   Past Surgical History  Breast Biopsy  Bilateral. Knee Surgery  Left. Thyroid Surgery   Diagnostic Studies History  Colonoscopy  1-5 years ago Mammogram  within last year Pap Smear  1-5 years ago  Allergies  No Known Drug Allergies   Medication History  Atenolol (25MG  Tablet, Oral) Active. Synthroid (100MCG Tablet, Oral) Active. Medications Reconciled  Social History  Alcohol use  Occasional alcohol use. Caffeine use  Tea. No drug use  Tobacco use  Never smoker.  Family History  Colon Cancer  Brother, Father, Mother. Diabetes Mellitus  Mother. Hypertension  Mother.  Pregnancy / Birth History  Age at menarche  61 years. Gravida  2 Irregular periods  Maternal age  54-40 Para  0  Other Problems  High blood pressure  Lump In Breast  Migraine Headache  Thyroid Disease     Review of Systems  General Not Present- Appetite Loss, Chills, Fatigue, Fever, Night Sweats, Weight Gain and Weight Loss. Skin Not Present- Change in Wart/Mole, Dryness, Hives, Jaundice, New Lesions, Non-Healing Wounds, Rash and Ulcer. HEENT Present- Wears glasses/contact lenses. Not Present- Earache,  Hearing Loss, Hoarseness, Nose Bleed, Oral Ulcers, Ringing in the Ears, Seasonal Allergies, Sinus Pain, Sore Throat, Visual Disturbances and Yellow Eyes. Respiratory Present- Snoring. Not Present- Bloody sputum, Chronic Cough, Difficulty Breathing and Wheezing. Breast Present- Nipple Discharge. Not Present- Breast Mass, Breast Pain and Skin Changes. Cardiovascular Not Present- Chest Pain, Difficulty Breathing Lying Down, Leg Cramps, Palpitations, Rapid Heart Rate, Shortness of Breath and Swelling of Extremities. Gastrointestinal Not Present- Abdominal Pain, Bloating, Bloody Stool, Change in Bowel Habits, Chronic diarrhea, Constipation, Difficulty Swallowing, Excessive gas, Gets full quickly at meals, Hemorrhoids, Indigestion, Nausea, Rectal Pain and Vomiting. Female Genitourinary Not Present- Frequency, Nocturia, Painful Urination, Pelvic Pain and Urgency. Musculoskeletal Not Present- Back Pain, Joint Pain, Joint Stiffness, Muscle Pain, Muscle Weakness and Swelling of Extremities. Neurological Not Present- Decreased Memory, Fainting, Headaches, Numbness, Seizures, Tingling, Tremor, Trouble walking and Weakness. Psychiatric Not Present- Anxiety, Bipolar, Change in Sleep Pattern, Depression, Fearful and Frequent crying. Endocrine Not Present- Cold Intolerance, Excessive Hunger, Hair Changes, Heat Intolerance, Hot flashes and New Diabetes. Hematology Not Present- Blood Thinners, Easy Bruising, Excessive bleeding, Gland problems, HIV and Persistent Infections.  Vitals  Weight: 254.4 lb Height: 65in Body Surface Area: 2.19 m Body Mass Index: 42.33 kg/m  Temp.: 97.77F (Oral)  Pulse: 77 (Regular)  BP: 136/82(Sitting, Left Arm, Standard)       Physical Exam  General Mental Status-Alert. General Appearance-Consistent with stated age. Hydration-Well hydrated. Voice-Normal.  Head and Neck Head-normocephalic, atraumatic with no lesions or palpable  masses. Trachea-midline. Thyroid Gland Characteristics - normal size and consistency.  Eye Eyeball - Bilateral-Extraocular movements intact. Sclera/Conjunctiva - Bilateral-No scleral icterus.  Chest and Lung Exam Chest and lung exam reveals -quiet, even and easy respiratory effort with no use of accessory muscles and on auscultation, normal breath sounds, no adventitious sounds and normal vocal resonance. Inspection Chest Wall - Normal. Back - normal.  Breast Note: She has a very large symmetric breast tissue bilaterally. There is no palpable mass in either breast. There is no palpable axillary, supra clavicular, or cervical lymphadenopathy.   Cardiovascular Cardiovascular examination reveals -normal heart sounds, regular rate and rhythm with no murmurs and normal pedal pulses bilaterally.  Abdomen Inspection Inspection of the abdomen reveals - No Hernias. Skin - Scar - no surgical scars. Palpation/Percussion Palpation and Percussion of the abdomen reveal - Soft, Non Tender, No Rebound tenderness, No Rigidity (guarding) and No hepatosplenomegaly. Auscultation Auscultation of the abdomen reveals - Bowel sounds normal.  Neurologic Neurologic evaluation reveals -alert and oriented x 3 with no impairment of recent or remote memory. Mental Status-Normal.  Musculoskeletal Normal Exam - Left-Upper Extremity Strength Normal and Lower Extremity Strength Normal. Normal Exam - Right-Upper Extremity Strength Normal and Lower Extremity Strength Normal.  Lymphatic Head & Neck  General Head & Neck Lymphatics: Bilateral - Description - Normal. Axillary  General Axillary Region: Bilateral - Description - Normal. Tenderness - Non Tender. Femoral & Inguinal  Generalized Femoral & Inguinal Lymphatics: Bilateral - Description - Normal. Tenderness - Non Tender.    Assessment & Plan   INTRADUCTAL PAPILLOMA OF BREAST, RIGHT (D24.1) Impression: The patient appears to  have 2 areas of intraductal papilloma in the right breast as well as bloody nipple discharge. At least one of these areas measured 2.7 cm. Because of the size of the area involved and because of the bloody nipple discharge are recommendation would be for lumpectomy of the 2 areas of papilloma. We would plan for this because of the risk of missing something more significant. Given her breast size she is also very interested in breast reduction and I think these 2 things could be done at the same time. I will refer her to plastic surgery for consideration of this and then we will coordinate together. She will require 2 radioactive seed localized lumpectomies of the right breast. I have discussed with her in detail the risks and benefits of the operation as well as some of the technical aspects and she understands and wishes to proceed  Current Plans Referred to Surgery - Plastic, for evaluation and follow up (Plastic Surgery). Routine. Pt Education - Breast Diseases: discussed with patient and provided information.

## 2019-05-01 NOTE — Op Note (Signed)
Breast Reduction Op note:    DATE OF PROCEDURE: 05/01/2019  LOCATION: [place]  SURGEON: Lyndee Leo Sanger Lavine Hargrove, DO  ASSISTANT: Roetta Sessions, PA and [place], RNFA  PREOPERATIVE DIAGNOSIS 1. Macromastia 2. Neck Pain / Back Pain 3. Right Breast disease  POSTOPERATIVE DIAGNOSIS 1. Macromastia 2. Neck Pain / Back Pain 3. Right Breast disease  PROCEDURES 1. Bilateral breast reduction.  Right reduction 2336g, Left reduction 123456  COMPLICATIONS: None.  DRAINS: bilateral  INDICATIONS FOR PROCEDURE Monica Spencer is a 48 y.o. year old female born on Nov 26, 1970, with a history of symptomatic macromastia with concominant back pain, neck pain, shoulder grooving from her bra.   MRN: [id number]  CONSENT Informed consent was obtained directly from the patient. The risks, benefits and alternatives were fully discussed. Specific risks including but not limited to bleeding, infection, hematoma, seroma, scarring, pain, nipple necrosis, asymmetry, poor cosmetic results, and need for further surgery were discussed. The patient had ample opportunity to have her questions answered to her satisfaction.  DESCRIPTION OF PROCEDURE  Patient was brought into the operating room and placed in a supine position.  SCDs were placed and appropriate padding was performed.  Antibiotics were given. The patient underwent general anesthesia and the chest was prepped and draped in a sterile fashion.  A timeout was performed and all information was confirmed to be correct.  We assisted General surgery for their portion of the case on the right lumpectomy / partial mastectomy.  Right:  Preoperative markings were confirmed.  Incision lines were injected with 1% Xylocaine with epinephrine.  After waiting for vasoconstriction, the marked lines were incised.  An inferior pedical breast reduction was performed by de-epithelializing the pedicle, using bovie to create the lateral and  medial pedicles, and removing breast tissue from the superior, lateral, and medial portions of the breast.  Care was taken to not undermine the breast pedicle. Hemostasis was achieved.  The nipple was gently rotated into position and the skin was temporarily closed with staples.  The patient was sat upright and size and shape symmetry was confirmed.  The pocket was irrigated, a drain was placed, and hemostasis confirmed.  The deep tissues were approximated with 3-0 Monocryl sutures and the skin was closed with deep dermal and subcuticular 4-0 Monocryl sutures followed by 5-0 Monocryl.  The nipple areola complex was brought out with the skin de-epithelialized at the location to make place for the complex.  The area was secured with 4-0 Monocryl at the deep layers followed by 5-0 Monocryl.  The nipple and skin flaps had good capillary refill at the end of the procedure. The patient tolerated the procedure well. The patient was allowed to wake from anesthesia and taken to the recovery room in satisfactory condition.  The specimens on the right were sent as superior, lateral and medial.   Left:  Preoperative markings were confirmed.  Incision lines were injected with 1% Xylocaine with epinephrine.  After waiting for vasoconstriction, the marked lines were incised.  An inferior pedical breast reduction was performed by de-epithelializing the pedicle, using bovie to create the lateral and medial pedicles, and removing breast tissue from the superior, lateral, and medial portions of the breast.  Care was taken to not undermine the breast pedicle. Hemostasis was achieved.  The nipple was gently rotated into position and the skin was temporarily closed with staples.  The patient was sat upright and size and shape symmetry was confirmed.  The pocket was irrigated, a drain was  placed, and hemostasis confirmed.  The deep tissues were approximated with 3-0 Monocryl sutures and the skin was closed with deep dermal and  subcuticular 4-0 Monocryl sutures followed by 5-0 Monocryl.  The nipple areola complex was brought out with the skin de-epithelialized at the location to make place for the complex.  The area was secured with 4-0 Monocryl at the deep layers followed by 5-0 Monocryl.  The nipple and skin flaps had good capillary refill at the end of the procedure. The patient tolerated the procedure well. The patient was allowed to wake from anesthesia and taken to the recovery room in satisfactory condition.  The drains were sutured in place with a 3-0 Silk to the skin.  The advanced practice practitioner (APP) assisted throughout the case.  The APP was essential in retraction and counter traction when needed to make the case progress smoothly.  This retraction and assistance made it possible to see the tissue plans for the procedure.  The assistance was needed for blood control, tissue re-approximation and assisted with closure of the incision site.

## 2019-05-01 NOTE — Discharge Instructions (Signed)
INSTRUCTIONS FOR AFTER SURGERY   You are having surgery.  You will likely have some questions about what to expect following your operation.  The following information will help you and your family understand what to expect when you are discharged from the hospital.  Following these guidelines will help ensure a smooth recovery and reduce risks of complications.   Postoperative instructions include information on: diet, wound care, medications and physical activity.  AFTER SURGERY Expect to go home after the procedure.  In some cases, you may need to spend one night in the hospital for observation.  DIET This surgery does not require a specific diet.  However, I have to mention that the healthier you eat the better your body can start healing. It is important to increasing your protein intake.  This means limiting the foods with sugar and carbohydrates.  Focus on vegetables and some meat.  If you have any liposuction during your procedure be sure to drink water.  If your urine is bright yellow, then it is concentrated, and you need to drink more water.  As a general rule after surgery, you should have 8 ounces of water every hour while awake.  If you find you are persistently nauseated or unable to take in liquids let us know.  NO TOBACCO USE or EXPOSURE.  This will slow your healing process and increase the risk of a wound.  WOUND CARE If you have a drain: Clean with baby wipes until the drain is removed.   If you have steri-strips / tape directly attached to your skin leave them in place. It is OK to get these wet.  No baths, pools or hot tubs for two weeks. We close your incision to leave the smallest and best-looking scar. No ointment or creams on your incisions until given the go ahead.  Especially not Neosporin (Too many skin reactions with this one).  A few weeks after surgery you can use Mederma and start massaging the scar. We ask you to wear your binder or sports bra for the first 6 weeks  around the clock, including while sleeping. This provides added comfort and helps reduce the fluid accumulation at the surgery site.  ACTIVITY No heavy lifting until cleared by the doctor.  It is OK to walk and climb stairs. In fact, moving your legs is very important to decrease your risk of a blood clot.  It will also help keep you from getting deconditioned.  Every 1 to 2 hours get up and walk for 5 minutes. This will help with a quicker recovery back to normal.  Let pain be your guide so you don't do too much.  NO, you cannot do the spring cleaning and don't plan on taking care of anyone else.  This is your time for TLC.  You will be more comfortable if you sleep and rest with your head elevated either with a few pillows under you or in a recliner.  No stomach sleeping for a few months.  WORK Everyone returns to work at different times. As a rough guide, most people take at least 1 - 2 weeks off prior to returning to work. If you need documentation for your job, bring the forms to your postoperative follow up visit.  DRIVING Arrange for someone to bring you home from the hospital.  You may be able to drive a few days after surgery but not while taking any narcotics or valium.  BOWEL MOVEMENTS Constipation can occur after anesthesia and while  taking pain medication.  It is important to stay ahead for your comfort.  We recommend taking Milk of Magnesia (2 tablespoons; twice a day) while taking the pain pills.  SEROMA This is fluid your body tried to put in the surgical site.  This is normal but if it creates tight skinny skin let us know.  It usually decreases in a few weeks.  WHEN TO CALL Call your surgeon's office if any of the following occur:  Fever 101 degrees F or greater  Excessive bleeding or fluid from the incision site.  Pain that increases over time without aid from the medications  Redness, warmth, or pus draining from incision sites  Persistent nausea or inability to take  in liquids  Severe misshapen area that underwent the operation.   Next dose of Tylenol 3:30pm on 05/01/2019 if needed  No Ibuprofen until 5:30 pm on 05/01/2019 in needed    Post Anesthesia Home Care Instructions  Activity: Get plenty of rest for the remainder of the day. A responsible individual must stay with you for 24 hours following the procedure.  For the next 24 hours, DO NOT: -Drive a car -Paediatric nurse -Drink alcoholic beverages -Take any medication unless instructed by your physician -Make any legal decisions or sign important papers.  Meals: Start with liquid foods such as gelatin or soup. Progress to regular foods as tolerated. Avoid greasy, spicy, heavy foods. If nausea and/or vomiting occur, drink only clear liquids until the nausea and/or vomiting subsides. Call your physician if vomiting continues.  Special Instructions/Symptoms: Your throat may feel dry or sore from the anesthesia or the breathing tube placed in your throat during surgery. If this causes discomfort, gargle with warm salt water. The discomfort should disappear within 24 hours.   About my Jackson-Pratt Bulb Drain  What is a Jackson-Pratt bulb? A Jackson-Pratt is a soft, round device used to collect drainage. It is connected to a long, thin drainage catheter, which is held in place by one or two small stiches near your surgical incision site. When the bulb is squeezed, it forms a vacuum, forcing the drainage to empty into the bulb.  Emptying the Jackson-Pratt bulb- To empty the bulb: 1. Release the plug on the top of the bulb. 2. Pour the bulb's contents into a measuring container which your nurse will provide. 3. Record the time emptied and amount of drainage. Empty the drain(s) as often as your     doctor or nurse recommends.  Date                  Time                    Amount (Drain 1)                 Amount (Drain  2)  _____________________________________________________________________  _____________________________________________________________________  _____________________________________________________________________  _____________________________________________________________________  _____________________________________________________________________  _____________________________________________________________________  _____________________________________________________________________  _____________________________________________________________________  Squeezing the Jackson-Pratt Bulb- To squeeze the bulb: 1. Make sure the plug at the top of the bulb is open. 2. Squeeze the bulb tightly in your fist. You will hear air squeezing from the bulb. 3. Replace the plug while the bulb is squeezed. 4. Use a safety pin to attach the bulb to your clothing. This will keep the catheter from     pulling at the bulb insertion site.  When to call your doctor- Call your doctor if:  Drain site becomes red, swollen or hot.  You have a  fever greater than 101 degrees F.  There is oozing at the drain site.  Drain falls out (apply a guaze bandage over the drain hole and secure it with tape).  Drainage increases daily not related to activity patterns. (You will usually have more drainage when you are active than when you are resting.)  Drainage has a bad odor.

## 2019-05-01 NOTE — Anesthesia Postprocedure Evaluation (Signed)
Anesthesia Post Note  Patient: Monica Spencer  Procedure(s) Performed: RIGHT BREAST LUMPECTOMY WITH RADIOACTIVE SEED LOCALIZATION X 2 (Right Breast) BILATERAL MAMMARY REDUCTION  (BREAST) (Bilateral Breast)     Patient location during evaluation: PACU Anesthesia Type: General Level of consciousness: awake and alert Pain management: pain level controlled Vital Signs Assessment: post-procedure vital signs reviewed and stable Respiratory status: spontaneous breathing, nonlabored ventilation, respiratory function stable and patient connected to nasal cannula oxygen Cardiovascular status: blood pressure returned to baseline and stable Postop Assessment: no apparent nausea or vomiting Anesthetic complications: no    Last Vitals:  Vitals:   05/01/19 1615 05/01/19 1621  BP: (!) 144/89 115/68  Pulse: 72 69  Resp: (!) 9 15  Temp:    SpO2: 98% 99%    Last Pain:  Vitals:   05/01/19 1621  TempSrc:   PainSc: 4                  Barnet Glasgow

## 2019-05-01 NOTE — Interval H&P Note (Signed)
History and Physical Interval Note:  05/01/2019 10:08 AM  Monica Spencer  has presented today for surgery, with the diagnosis of RIGHT BREAST PAPILLOMA.  The various methods of treatment have been discussed with the patient and family. After consideration of risks, benefits and other options for treatment, the patient has consented to  Procedure(s): RIGHT BREAST LUMPECTOMY WITH RADIOACTIVE SEED LOCALIZATION X 2 (Right) MAMMARY REDUCTION  (BREAST) (Bilateral) as a surgical intervention.  The patient's history has been reviewed, patient examined, no change in status, stable for surgery.  I have reviewed the patient's chart and labs.  Questions were answered to the patient's satisfaction.     Loel Lofty Dillingham

## 2019-05-01 NOTE — Transfer of Care (Signed)
Immediate Anesthesia Transfer of Care Note  Patient: Monica Spencer  Procedure(s) Performed: RIGHT BREAST LUMPECTOMY WITH RADIOACTIVE SEED LOCALIZATION X 2 (Right Breast) BILATERAL MAMMARY REDUCTION  (BREAST) (Bilateral Breast)  Patient Location: PACU  Anesthesia Type:General  Level of Consciousness: drowsy  Airway & Oxygen Therapy: Patient Spontanous Breathing and Patient connected to face mask oxygen  Post-op Assessment: Report given to RN and Post -op Vital signs reviewed and stable  Post vital signs: Reviewed and stable  Last Vitals:  Vitals Value Taken Time  BP 142/89 05/01/19 1528  Temp    Pulse 82 05/01/19 1530  Resp 14 05/01/19 1529  SpO2 100 % 05/01/19 1530  Vitals shown include unvalidated device data.  Last Pain:  Vitals:   05/01/19 0913  TempSrc: Oral  PainSc: 0-No pain         Complications: No apparent anesthesia complications

## 2019-05-02 ENCOUNTER — Telehealth: Payer: Self-pay | Admitting: *Deleted

## 2019-05-02 ENCOUNTER — Encounter (HOSPITAL_BASED_OUTPATIENT_CLINIC_OR_DEPARTMENT_OTHER): Payer: Self-pay | Admitting: General Surgery

## 2019-05-02 ENCOUNTER — Telehealth: Payer: Self-pay

## 2019-05-02 NOTE — Telephone Encounter (Addendum)
Called the patient back regarding the message below.  She stated she felt that her clothes were wet on the (R) side so she went to the bathroom and she felt herself feeling a little dizzy so she slowly lowered herself to the floor.  She stated she don't think she passed out.  She just remembered getting herself up off the floor.  She noticed a lot of leaking coming from the (R) drain.  She believe the bulb was not squeezed in good and it was leaking from the tube site.  She said she cleaned herself up and recompressed the bulb.  She then place vaseline and gauze on the drain site.  She said the fluid is draining into the bulb.  The output has been (R):(75) @ 1:55pm.  Asked the patient how the (L) side was doing and she stated no leakage.//AB/CMA

## 2019-05-02 NOTE — Telephone Encounter (Signed)
Gave the patient a call back and informed her that I spoke with [facility] and he would like for her to put Vaseline on the drain site, and cover it with a gauze.  Patient verbalized understanding and agreed.//AB/CMA

## 2019-05-02 NOTE — Telephone Encounter (Signed)
Received a call from the patient she states she had a (B) breast reduction on yesterday, and she is noticing leakage from the (L) drainage site.  She corrected 60cc in the bulb this morning.  Informed the patient that I will speak with Matthew,PA and give her a call back.  Patient verbalized understanding and agreed.//AB/CMA

## 2019-05-02 NOTE — Telephone Encounter (Signed)
Patient requesting call back from clinical staff

## 2019-05-03 NOTE — Telephone Encounter (Signed)
Called the patient back to see how she was doing.  She stated no leakage on the (L) side, and just a small leakage on the (R).  The output for the (R) has been (80),(70).  She stated that she has placed vaseline and gauze at the drain sites.  Informed the patient to continue to record the fluid output.  Try not to do to much lifting and moving.  Asked the patient to give [place] a call back if she needs [place].  Patient verbalized understanding and agreed.//AB/CMA

## 2019-05-06 LAB — SURGICAL PATHOLOGY

## 2019-05-09 ENCOUNTER — Telehealth: Payer: Self-pay

## 2019-05-09 NOTE — Telephone Encounter (Signed)
Called patient to confirm appointment scheduled for tomorrow. Patient answered the following questions: 1. To the best of your knowledge, have you been in close contact with any one with a confirmed diagnosis of COVID-19? No 2. Have you had any one or more of the following; fever, chills, cough, shortness of breath, or any flu-like symptoms? No 3. Have you been diagnosed with or have a previous diagnosis of COVID 19? No 4. I am going to go over a few other symptoms with you. Please let me know if you are experiencing any of the following: One of the below:  a. Ear, nose, or throat discomfort b. A sore throat c. Headache- Yes d. Muscle pain e. Diarrhea f. Loss of taste or smell

## 2019-05-10 ENCOUNTER — Ambulatory Visit (INDEPENDENT_AMBULATORY_CARE_PROVIDER_SITE_OTHER): Payer: BC Managed Care – PPO | Admitting: Plastic Surgery

## 2019-05-10 ENCOUNTER — Encounter: Payer: Self-pay | Admitting: Plastic Surgery

## 2019-05-10 ENCOUNTER — Other Ambulatory Visit: Payer: Self-pay

## 2019-05-10 VITALS — BP 116/67 | HR 85 | Temp 97.8°F | Ht 65.0 in | Wt 244.0 lb

## 2019-05-10 DIAGNOSIS — N62 Hypertrophy of breast: Secondary | ICD-10-CM

## 2019-05-10 NOTE — Progress Notes (Signed)
   Subjective:    Patient ID: Monica Spencer, female    DOB: 03/28/1971, 48 y.o.   MRN: [id number]  The patient is a 48 year old female here for follow-up after undergoing a breast reduction.  She is extremely pleased with her current size.  She has had a little bit of output in the drains but nothing significant.  I tried to massage the breasts and no additional fluid was drained.  Her incisions are healing well.  No sign of infection.  Good capillary refill and blood flow to nipple areola complex.     Review of Systems  Constitutional: Negative.   HENT: Negative.   Eyes: Negative.   Respiratory: Negative.   Cardiovascular: Negative.   Gastrointestinal: Negative.   Endocrine: Negative.   Genitourinary: Negative.   Musculoskeletal: Negative.   Skin: Negative.   Psychiatric/Behavioral: Negative.        Objective:   Physical Exam Vitals signs and nursing note reviewed.  Constitutional:      Appearance: Normal appearance.  Neck:     Musculoskeletal: Normal range of motion.  Cardiovascular:     Rate and Rhythm: Normal rate.     Pulses: Normal pulses.  Neurological:     General: No focal deficit present.     Mental Status: She is alert. Mental status is at baseline.  Psychiatric:        Mood and Affect: Mood normal.        Behavior: Behavior normal.        Assessment & Plan:     ICD-10-CM   1. Symptomatic mammary hypertrophy  N62     The drains were removed.  Follow-up in 2 to 3 weeks.  She can go into a sports bra.  Make sure it is not too tight.  The one she brought in today was too tight.

## 2019-05-16 NOTE — Progress Notes (Signed)
Subjective:     Patient ID: Monica Spencer, female    DOB: 1971-06-26, 48 y.o.   MRN: [id number]  Chief Complaint  Patient presents with  . Follow-up    for (R) breast drainage    HPI: The patient is a 48 y.o. female here for follow-up after undergoing a breast reduction.  She was last seen on 05/10/2019 and her drains were removed due to low output.  She called the office yesterday reporting that she had some swelling in her breast and would like to be seen for evaluation.  She reports that the right breast had been swollen over the past few days and last night she noticed a lot of drainage that occurred.  She had to change her bandage multiple times.  The drainage was serosanguineous in nature and was noted on dressing she had placed.  No fever, chills, nausea, vomiting.  No purulent drainage.  There is no discernible fluid wave on exam.  But it does appear slightly more swollen on the right than the left.  Incisions are all C/D/I.  No dehiscence or erythema noted.  Drainage appears to be coming from her previous JP drain site..  She reports that she feels a little bit more tired than usual.  But she denies any dizziness, loss of consciousness, significant fatigue.  She has been eating and drinking normally.  Review of Systems  Constitutional: Positive for malaise/fatigue. Negative for chills, diaphoresis, fever and weight loss.  Respiratory: Negative.   Cardiovascular: Negative.   Gastrointestinal: Negative for abdominal pain, nausea and vomiting.  Musculoskeletal: Negative.  Negative for back pain, myalgias and neck pain.  Skin: Negative for itching and rash.  Neurological: Positive for weakness. Negative for dizziness and headaches.     Objective:   Vital Signs BP 117/74 (BP Location: Left Arm, Patient Position: Sitting, Cuff Size: Large)   Pulse 93   Temp (!) 97.5 F (36.4 C) (Temporal)   Ht 5\' 5"  (1.651 m)   Wt 244 lb 12.8 oz (111 kg)   LMP 04/03/2019  (Exact Date)   SpO2 98%   BMI 40.74 kg/m  Vital Signs and Nursing Note Reviewed Chaperone present Physical Exam  Constitutional: She is oriented to person, place, and time and well-developed, well-nourished, and in no distress.  HENT:  Head: Normocephalic and atraumatic.  Cardiovascular: Normal rate.  Pulmonary/Chest: Effort normal.    Musculoskeletal: Normal range of motion.  Neurological: She is alert and oriented to person, place, and time. Gait normal.  Skin: Skin is warm and dry. No rash noted. She is not diaphoretic. No erythema. No pallor.  Psychiatric: Mood and affect normal.      Assessment/Plan:     ICD-10-CM   1. Symptomatic mammary hypertrophy  N62   2. Neck pain  M54.2   3. Chronic bilateral thoracic back pain  M54.6    G89.29    Right breast aspirated using a sterile technique.  No fluid aspirated.  It is possible that the majority of the fluid drained from her JP drain insertion site after removal of JP drain.  There does not appear to be any infection.   Overall her incisions are healing well.  She is scheduled to follow-up in approximately 20 days.  She knows to call with any question concerns prior to then.  If swelling does not improve, call.  If she develops any fever, chills, nausea or vomiting call the office.  Return precautions provided.  Call us with any questions or  concerns.  Carola Rhine Anagabriela Jokerst, PA-C 05/17/2019, 3:18 PM

## 2019-05-17 ENCOUNTER — Encounter: Payer: Self-pay | Admitting: Surgical

## 2019-05-17 ENCOUNTER — Other Ambulatory Visit: Payer: Self-pay

## 2019-05-17 ENCOUNTER — Ambulatory Visit (INDEPENDENT_AMBULATORY_CARE_PROVIDER_SITE_OTHER): Payer: BC Managed Care – PPO | Admitting: Surgical

## 2019-05-17 VITALS — BP 117/74 | HR 93 | Temp 97.5°F | Ht 65.0 in | Wt 244.8 lb

## 2019-05-17 DIAGNOSIS — G8929 Other chronic pain: Secondary | ICD-10-CM

## 2019-05-17 DIAGNOSIS — M546 Pain in thoracic spine: Secondary | ICD-10-CM

## 2019-05-17 DIAGNOSIS — M542 Cervicalgia: Secondary | ICD-10-CM

## 2019-05-17 DIAGNOSIS — N62 Hypertrophy of breast: Secondary | ICD-10-CM

## 2019-05-27 ENCOUNTER — Encounter: Payer: Self-pay | Admitting: Surgical

## 2019-05-27 ENCOUNTER — Other Ambulatory Visit: Payer: Self-pay

## 2019-05-27 ENCOUNTER — Ambulatory Visit (INDEPENDENT_AMBULATORY_CARE_PROVIDER_SITE_OTHER): Payer: BC Managed Care – PPO | Admitting: Surgical

## 2019-05-27 VITALS — BP 124/84 | HR 82 | Temp 97.8°F

## 2019-05-27 DIAGNOSIS — M542 Cervicalgia: Secondary | ICD-10-CM

## 2019-05-27 DIAGNOSIS — N62 Hypertrophy of breast: Secondary | ICD-10-CM

## 2019-05-27 DIAGNOSIS — M546 Pain in thoracic spine: Secondary | ICD-10-CM

## 2019-05-27 DIAGNOSIS — G8929 Other chronic pain: Secondary | ICD-10-CM

## 2019-05-27 MED ORDER — DOXYCYCLINE HYCLATE 100 MG PO TABS: 100.0000 mg | ORAL_TABLET | Freq: Two times a day (BID) | ORAL | 0 refills | Status: AC

## 2019-05-27 NOTE — Progress Notes (Signed)
Subjective:     Patient ID: Monica Spencer, female    DOB: 10/27/1970, 48 y.o.   MRN: [id number]  Chief Complaint  Patient presents with  . Follow-up    HPI: The patient is a 48 y.o. female here for follow-up after bilateral breast reduction 1 month ago. Last seen 10 days ago and had some right breast swelling.   She called over the weekend reporting additional drainage. Her right breast is erythematous and she has some new additional wounds on the vertical limb of her right breast. She reports that Saturday, she woke up in the middle of the night with significant serosanguinous drainage on her breast binder/bandages. The drainage has improved since then, but it is still draining serosanguinous fluid at this time from multiple sites. It is non-purulent. She is also still having drainage from her right JP drain site.  She denies any fevers, chills, n/v. Denies any dizziness, tachycardia.  Her left breast is healing well with no swelling noted. Her incisions on the left are c/d/i. 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  Review of Systems  Constitutional: Negative for chills, diaphoresis, fever, malaise/fatigue and weight loss.       + activity change   Respiratory: Negative for cough, hemoptysis, sputum production, shortness of breath and wheezing.   Cardiovascular: Negative for chest pain, palpitations, claudication and leg swelling.  Gastrointestinal: Negative for abdominal pain, diarrhea, nausea and vomiting.  Genitourinary: Negative.   Musculoskeletal: Negative for myalgias.  Skin: Negative for itching and rash.  Neurological: Positive for sensory change. Negative for dizziness, weakness and headaches.     Objective:   Vital Signs BP 124/84 (BP Location: Left Arm, Patient Position: Sitting, Cuff Size: Large)   Pulse 82   Temp 97.8 F (36.6 C) (Temporal)   SpO2 98%  Vital Signs and Nursing Note Reviewed Chaperone present Physical Exam  Constitutional:  She is oriented to person, place, and time and well-developed, well-nourished, and in no distress.  HENT:  Head: Normocephalic and atraumatic.  Cardiovascular: Normal rate.  Pulmonary/Chest: Effort normal.    Abdominal: Soft. Bowel sounds are normal. She exhibits no distension. There is no abdominal tenderness.  Musculoskeletal: Normal range of motion.        General: No tenderness or edema.  Neurological: She is alert and oriented to person, place, and time. Gait normal.  Skin: Skin is warm and dry. No rash noted. She is not diaphoretic. There is erythema. No pallor.  Psychiatric: Mood and affect normal.    Assessment/Plan:     ICD-10-CM   1. Symptomatic mammary hypertrophy  N62   2. Chronic bilateral thoracic back pain  M54.6    G89.29   3. Neck pain  M54.2    Mrs. Kisiel is 1 month post op from bilateral breast reduction.  Fluid wave seen on exam, failed to aspirate fluid from R breast using sterile technique. The right breast is swelling and cellulitic with new wounds at the vertical limb draining serosanguinous fluid at a slow, but constant rate. She has been keeping dressings over the wounds.   No fevers, chills, n/v. Due to erythema and swelling, antibiotic sent to pharmacy for coverage. Patient to follow up in 2 days, if not worsening, she can call and reschedule for next week. She knows to call with any questions or concerns. Call if she develops fever, chills, n/v, dizziness, or has any other concerns. She verbalized understanding.  Follow up in 2 days.  Carola Rhine Raoul Ciano, PA-C 05/27/2019, 2:17 PM

## 2019-05-29 ENCOUNTER — Ambulatory Visit (INDEPENDENT_AMBULATORY_CARE_PROVIDER_SITE_OTHER): Payer: BC Managed Care – PPO | Admitting: Surgical

## 2019-05-29 ENCOUNTER — Encounter: Payer: Self-pay | Admitting: Surgical

## 2019-05-29 ENCOUNTER — Other Ambulatory Visit: Payer: Self-pay

## 2019-05-29 VITALS — BP 118/80 | HR 75 | Temp 97.1°F | Ht 65.0 in | Wt 240.6 lb

## 2019-05-29 DIAGNOSIS — G8929 Other chronic pain: Secondary | ICD-10-CM

## 2019-05-29 DIAGNOSIS — M542 Cervicalgia: Secondary | ICD-10-CM

## 2019-05-29 DIAGNOSIS — L539 Erythematous condition, unspecified: Secondary | ICD-10-CM

## 2019-05-29 DIAGNOSIS — M546 Pain in thoracic spine: Secondary | ICD-10-CM

## 2019-05-29 DIAGNOSIS — N62 Hypertrophy of breast: Secondary | ICD-10-CM

## 2019-05-29 NOTE — Progress Notes (Signed)
   Subjective:     Patient ID: Monica Spencer, female    DOB: 1970-11-07, 48 y.o.   MRN: [id number]  Chief Complaint  Patient presents with  . Follow-up    for (B) breast reduction    HPI: The patient is a 48 y.o. female here for follow-up after b/l breast reduction 1 month ago. I last saw the patient 2 days ago for right breast swelling/drainage and erythema and she was started on doxycycline BID at that time.  She reports that she is doing better. She has not developed any fevers, chills, n/v.  Patient reports yesterday she noticed some increased swelling, but it quickly resolved. She continues to have serosanguinous drainage from the right breast and some erythema medially. The erythema has improved. She is on day 2 of doxycycline.   The right breast has formed an opening at the site of the previous mentioned wound at her visit 2 weeks ago. The wound is 0.5 x 0.5 cm x 0.4 cm and at the midline of vertical limb. The skin around the wound is peeling. No purulence noted. She is also draining serosanguinous fluid from her drain site on the right breast.   Review of Systems  Constitutional: Negative for chills, diaphoresis, fever, malaise/fatigue and weight loss.  Respiratory: Negative for cough, sputum production, shortness of breath and wheezing.   Cardiovascular: Negative for chest pain, palpitations, orthopnea, leg swelling and PND.  Gastrointestinal: Negative for abdominal pain, diarrhea, nausea and vomiting.  Genitourinary: Negative.   Musculoskeletal: Negative for back pain and neck pain.  Skin: Negative for itching and rash.  Neurological: Positive for sensory change. Negative for dizziness, focal weakness, weakness and headaches.     Objective:   Vital Signs There were no vitals taken for this visit. Vital Signs and Nursing Note Reviewed Chaperone present Physical Exam  Constitutional: She is oriented to person, place, and time and well-developed,  well-nourished, and in no distress.  HENT:  Head: Normocephalic and atraumatic.  Cardiovascular: Normal rate.  Pulmonary/Chest: Effort normal. No respiratory distress.    Abdominal: Soft.  Musculoskeletal: Normal range of motion.        General: No tenderness or edema.  Neurological: She is alert and oriented to person, place, and time. Gait normal.  Skin: Skin is warm and dry. No rash noted. She is not diaphoretic. There is erythema. No pallor.  Psychiatric: Mood and affect normal.      Assessment/Plan:     ICD-10-CM   1. Symptomatic mammary hypertrophy  N62   2. Chronic bilateral thoracic back pain  M54.6    G89.29   3. Neck pain  M54.2   4. Erythema of breast  L53.9     Continue with antibiotic.  Maintain good hydration, healthy eating. Multivitamin, vitamin C.  Call if symptoms worsen or do not improve over the next few days. She is scheduled for a follow up on 10/23. If she needs to be seen sooner, she knows to call.   Reasons to call include fever, chills, malaise/fatigue, weakness, significant drainage, bleeding, increased swelling, increased erythema, no improvement. Patient understood.  If swelling worsens, may need drain placed by radiology, will continue to monitor.  Follow up in 1 week.   Carola Rhine Ermina Oberman, PA-C 05/29/2019, 2:42 PM

## 2019-06-06 ENCOUNTER — Telehealth: Payer: Self-pay | Admitting: Surgical

## 2019-06-06 NOTE — Telephone Encounter (Signed)

## 2019-06-07 ENCOUNTER — Encounter: Payer: Self-pay | Admitting: Surgical

## 2019-06-07 ENCOUNTER — Ambulatory Visit (INDEPENDENT_AMBULATORY_CARE_PROVIDER_SITE_OTHER): Payer: BC Managed Care – PPO | Admitting: Plastic Surgery

## 2019-06-07 ENCOUNTER — Other Ambulatory Visit: Payer: Self-pay

## 2019-06-07 VITALS — BP 150/95 | HR 80 | Temp 97.3°F | Ht 65.0 in | Wt 240.2 lb

## 2019-06-07 DIAGNOSIS — N62 Hypertrophy of breast: Secondary | ICD-10-CM

## 2019-06-07 NOTE — Progress Notes (Signed)
Monica Spencer is a 48 year old female here for follow-up after undergoing bilateral breast reduction.  Overall she is doing well.  She feels she is doing much better than she was last week.  There is a 5 x 7 mm area of opening of skin on the vertical limb of the right breast.  Nothing looks infected.  She does not appear to have a hematoma or seroma on either side.  Donated a cell was placed on the right breast.  [place] for the next 3 days.  She can shower after that and continue with Vaseline daily.  I like to see her back in 2 weeks.

## 2019-06-20 NOTE — Progress Notes (Signed)
     Patient ID: Monica Spencer, female    DOB: 21-Apr-1971, 48 y.o.   MRN: [id number]   C.C.: post-op follow up  Monica Spencer is a 48 yo female who underwent bilateral breast reduction in coordination with general surgery for resection of right breast papilloma on 05/01/19. She developed a wound at the vertical limb of the right breast. Donated Acell was placed on the wound at her last visit. She presents today for continued post-op follow up. She states the right breast wound is still present, but getting better. She has been covering the wound with gauze. She believes the right breast is still bigger than the left, but much better.   Review of Systems  Constitutional: Positive for activity change.  HENT: Negative.   Respiratory: Negative.   Cardiovascular: Negative.   Gastrointestinal: Negative.   Genitourinary: Negative.   Musculoskeletal: Negative.   Skin:       Decreased sensation in right breast  Neurological: Negative.     Past Medical History:  Diagnosis Date  . Hypertension   . Thyroid disease     Past Surgical History:  Procedure Laterality Date  . BREAST LUMPECTOMY WITH RADIOACTIVE SEED LOCALIZATION Right 05/01/2019   Procedure: RIGHT BREAST LUMPECTOMY WITH RADIOACTIVE SEED LOCALIZATION X 2;  Surgeon: Jovita Kussmaul, MD;  Location: [place];  Service: General;  Laterality: Right;  . BREAST REDUCTION SURGERY Bilateral 05/01/2019   Procedure: BILATERAL MAMMARY REDUCTION  (BREAST);  Surgeon: Wallace Going, DO;  Location: [place];  Service: Plastics;  Laterality: Bilateral;  . ENDOMETRIAL ABLATION W/ NOVASURE    . KNEE ARTHROSCOPY Left   . MYOMECTOMY    . THYROIDECTOMY        Current Outpatient Medications:  .  atenolol (TENORMIN) 25 MG tablet, Take 1 tablet (25 mg total) by mouth daily., Disp: 90 tablet, Rfl: 3 .  Cholecalciferol 125 MCG (5000 UT) capsule, Take 5,000 Units by mouth daily., Disp: , Rfl:  .   levothyroxine (SYNTHROID) 100 MCG tablet, Take 1 tablet (100 mcg total) by mouth daily before breakfast., Disp: 90 tablet, Rfl: 3 .  OVER THE COUNTER MEDICATION, Centrum -Take 1 tablet by mouth daily., Disp: , Rfl:    Objective:   Vitals:   06/24/19 1122  BP: (!) 157/102  Pulse: 65  Temp: (!) 97.3 F (36.3 C)  SpO2: 100%    Physical Exam  General: alert, calm, no acute distress HEENT:normocephalic Neck: supple, full ROM Chest: symmetrical rise and fall Lungs: unlabored breathing Breast: 9 mm x 6 mm wound with granulation tissue on right vertical limb incision; right breast slightly larger than right, hyperpigmentation of outer side of right lower breast' left breast incision clean, dry, intact Cardiac: +2 bilateral radial pulse Abdomen: soft, non-distended Musculoskeletal: MAEx4 Neuro: A&O x3, calm, cooperative, steady gait Skin: warm, dry   Assessment & Plan:  Status post bilateral breast reduction  Ductal papilloma, R breast  Monica Spencer is a 48 yo female s/p bilateral breast reduction in coordination with general surgery for resection of right breast papilloma on 05/01/19. There is mild swelling in the right breast, but greatly improved from previous visits. No seroma or signs of infection. The right breast wound is healing. The wound was covered with vaseline and gauze. She may continue applying vaseline and gauze daily. Return in 2 weeks.   Alfredo Batty, NP

## 2019-06-24 ENCOUNTER — Other Ambulatory Visit: Payer: Self-pay

## 2019-06-24 ENCOUNTER — Encounter: Payer: Self-pay | Admitting: Nurse Practitioner

## 2019-06-24 ENCOUNTER — Ambulatory Visit (INDEPENDENT_AMBULATORY_CARE_PROVIDER_SITE_OTHER): Payer: BC Managed Care – PPO | Admitting: Nurse Practitioner

## 2019-06-24 VITALS — BP 157/102 | HR 65 | Temp 97.3°F | Ht 65.0 in | Wt 243.8 lb

## 2019-06-24 DIAGNOSIS — Z9889 Other specified postprocedural states: Secondary | ICD-10-CM

## 2019-06-24 DIAGNOSIS — D369 Benign neoplasm, unspecified site: Secondary | ICD-10-CM

## 2019-06-25 ENCOUNTER — Ambulatory Visit: Payer: BC Managed Care – PPO | Admitting: Surgical

## 2019-07-08 ENCOUNTER — Other Ambulatory Visit: Payer: Self-pay

## 2019-07-08 ENCOUNTER — Encounter: Payer: Self-pay | Admitting: Nurse Practitioner

## 2019-07-08 ENCOUNTER — Ambulatory Visit (INDEPENDENT_AMBULATORY_CARE_PROVIDER_SITE_OTHER): Payer: BC Managed Care – PPO | Admitting: Nurse Practitioner

## 2019-07-08 VITALS — BP 131/75 | HR 79 | Temp 98.3°F | Ht 65.0 in | Wt 240.0 lb

## 2019-07-08 DIAGNOSIS — D369 Benign neoplasm, unspecified site: Secondary | ICD-10-CM

## 2019-07-08 DIAGNOSIS — Z9889 Other specified postprocedural states: Secondary | ICD-10-CM

## 2019-07-08 NOTE — Progress Notes (Signed)
     Patient ID: Monica Spencer, female    DOB: 1970-10-26, 48 y.o.   MRN: [id number]   C.C.: follow up breast reduction  Monica Spencer is a 48 yo female who underwent bilateral breast reduction in coordination with general surgery for resection of right breast papilloma on 05/01/19. She developed a wound at the vertical limb of the right breast. Donated Acell was placed on the wound on 06/07/19. She presents today for continued post-op follow up. Patient states she is doing well and denies and pain. She has been putting vaseline on the wound and covering it with dry gauze. She has noticed the right breast is still slighter larger than the left.   Review of Systems  Constitutional: Negative.   HENT: Negative.   Respiratory: Negative.   Cardiovascular: Negative.   Gastrointestinal: Negative.   Genitourinary: Negative.   Musculoskeletal: Negative.   Skin: Positive for wound.  Neurological: Negative.     Past Medical History:  Diagnosis Date  . Hypertension   . Thyroid disease     Past Surgical History:  Procedure Laterality Date  . BREAST LUMPECTOMY WITH RADIOACTIVE SEED LOCALIZATION Right 05/01/2019   Procedure: RIGHT BREAST LUMPECTOMY WITH RADIOACTIVE SEED LOCALIZATION X 2;  Surgeon: Jovita Kussmaul, MD;  Location: [facility];  Service: General;  Laterality: Right;  . BREAST REDUCTION SURGERY Bilateral 05/01/2019   Procedure: BILATERAL MAMMARY REDUCTION  (BREAST);  Surgeon: Wallace Going, DO;  Location: [place];  Service: Plastics;  Laterality: Bilateral;  . ENDOMETRIAL ABLATION W/ NOVASURE    . KNEE ARTHROSCOPY Left   . MYOMECTOMY    . THYROIDECTOMY        Current Outpatient Medications:  .  atenolol (TENORMIN) 25 MG tablet, Take 1 tablet (25 mg total) by mouth daily., Disp: 90 tablet, Rfl: 3 .  Cholecalciferol 125 MCG (5000 UT) capsule, Take 5,000 Units by mouth daily., Disp: , Rfl:  .  levothyroxine (SYNTHROID) 100 MCG  tablet, Take 1 tablet (100 mcg total) by mouth daily before breakfast., Disp: 90 tablet, Rfl: 3 .  OVER THE COUNTER MEDICATION, Centrum -Take 1 tablet by mouth daily., Disp: , Rfl:    Objective:   Vitals:   07/08/19 1003  BP: 131/75  Pulse: 79  Temp: 98.3 F (36.8 C)  SpO2: 100%    Physical Exam  General: alert, calm, no acute distress HEENT:normocephalic Neck: full ROM Chest: symmetrical rise and fall Lungs: unlabored breathing Breast: soft, non-tender, right breast slightly larger than left, 0.5 cm x 0.2 cm open wound at vertical limb of right breast incision Abdomen: soft, non-distended Musculoskeletal: MAEx4 Neuro: A&O x3, calm, cooperative, steady gait Skin: warm, dry    Assessment & Plan:  Status post bilateral breast reduction  Ductal papilloma, R breast  Monica Spencer is a 48 yo female s/p bilateral breast reduction in coordination with general surgery for resection of right breast papilloma on 05/01/19. The right breast wound has decreased in size since her last visit. Right breast continues to be slighter larger than left, which may be related to residual tissue swelling. Will reassess in 1 month. Continue applying vasline to wound.     Alfredo Batty, NP

## 2019-08-06 ENCOUNTER — Other Ambulatory Visit: Payer: Self-pay

## 2019-08-06 ENCOUNTER — Ambulatory Visit (INDEPENDENT_AMBULATORY_CARE_PROVIDER_SITE_OTHER): Payer: BC Managed Care – PPO | Admitting: Plastic Surgery

## 2019-08-06 ENCOUNTER — Encounter: Payer: Self-pay | Admitting: Plastic Surgery

## 2019-08-06 VITALS — BP 141/93 | HR 78 | Temp 97.8°F | Ht 65.0 in | Wt 249.0 lb

## 2019-08-06 DIAGNOSIS — N62 Hypertrophy of breast: Secondary | ICD-10-CM

## 2019-08-06 NOTE — Progress Notes (Signed)
   Subjective:     Patient ID: Monica Spencer, female    DOB: 1971-04-13, 48 y.o.   MRN: [id number]  Chief Complaint  Patient presents with  . Follow-up    BL reduction follow up [place] 05/01/19 with HX right breast papilloma    HPI: The patient is a 48 y.o. female here for follow-up after bilateral breast reduction in coordination with general surgery for resection of right breast papilloma on 05/01/2019.  She developed a wound at the vertical limb of the right breast.  Donated a cell was placed on the wound on 06/07/2019. At 07/09/19 visit the right breast wound had decreased in size.   Today she reports that right breast wound has healed. No pain, drainage, redness. Reports some mild swelling of right breast and lingering numbness (lateral side).  Review of Systems  Constitutional: Negative for chills and fever.  Respiratory: Negative for shortness of breath.   Cardiovascular: Negative for chest pain and leg swelling.  Gastrointestinal: Negative for nausea and vomiting.  Skin: Negative for color change, pallor, rash and wound.       Some itching reported around incisions and lateral sides of breasts     Objective:   Vital Signs BP (!) 141/93 (BP Location: Left Arm, Patient Position: Sitting, Cuff Size: Large)   Pulse 78   Temp 97.8 F (36.6 C)   Ht 5\' 5"  (1.651 m)   Wt 249 lb (112.9 kg)   BMI 41.44 kg/m  Vital Signs and Nursing Note Reviewed  Physical Exam  Constitutional: She is oriented to person, place, and time and well-developed, well-nourished, and in no distress.  HENT:  Head: Normocephalic and atraumatic.  Eyes: EOM are normal.  Pulmonary/Chest: Effort normal.  Incisions are c/d/i. Right breast vertical limb wound has fully healed. Slight swelling of right breast. Some areas of dry skin on breasts. Firm  felt on palpation of right breast at 11:00 position just outside of NAC, suspect area of fat necrosis.   Musculoskeletal:        General: Normal range of  motion.     Cervical back: Normal range of motion.  Neurological: She is alert and oriented to person, place, and time. Gait normal.  Skin: Skin is warm and dry. No rash noted. No erythema. No pallor.  Psychiatric: Mood, memory, affect and judgment normal.    Assessment/Plan:     ICD-10-CM   1. Symptomatic mammary hypertrophy  N62     Ms. Kirst is doing very well and is very pleased with her results.  Incisions are well healed bilaterally. No redness, drainage, seroma/hematoma, or signs of infection. Slight swelling of right breast remains. Some areas of dryness. May apply vaseline to incisions and areas of dryness on breasts to moisturize.  Massage in circular motion firm area of suspected fat necrosis.  Follow-up in 1 month.   Threasa Heads, PA-C 08/06/2019, 1:33 PM

## 2019-09-05 ENCOUNTER — Telehealth: Payer: Self-pay | Admitting: Plastic Surgery

## 2019-09-05 NOTE — Telephone Encounter (Signed)

## 2019-09-06 ENCOUNTER — Other Ambulatory Visit: Payer: Self-pay

## 2019-09-06 ENCOUNTER — Ambulatory Visit (INDEPENDENT_AMBULATORY_CARE_PROVIDER_SITE_OTHER): Payer: 59 | Admitting: Plastic Surgery

## 2019-09-06 ENCOUNTER — Encounter: Payer: Self-pay | Admitting: Plastic Surgery

## 2019-09-06 DIAGNOSIS — Z9889 Other specified postprocedural states: Secondary | ICD-10-CM | POA: Insufficient documentation

## 2019-09-06 NOTE — Progress Notes (Signed)
   Subjective:     Patient ID: Monica Spencer, female    DOB: 1971/04/23, 49 y.o.   MRN: [id number]  Chief Complaint  Patient presents with  . Follow-up    follow up from bilateral breast reduction    HPI: The patient is a 49 y.o. female here for follow-up after bilateral breast reduction in coordination with general surgery for resection of right breast papilloma on 05/01/2019.  She developed a wound at the vertical limb of the right breast.  Donated Acell was placed on the wound on 06/07/2019.  At 07/09/2019 visit the right breast wound had decreased in size.  At 08/06/2019 right breast wound had closed.  Today she reports that she is doing very well.  She is very pleased with her results. Right breast wound remains closed, healing well..  Denies pain, drainage, redness.  Reports some lingering numbness on the lateral sides bilaterally.  Review of Systems  Constitutional: Negative for chills and fever.  Respiratory: Negative for shortness of breath.   Cardiovascular: Negative for chest pain and leg swelling.  Gastrointestinal: Negative for constipation, diarrhea, nausea and vomiting.  Skin: Negative for color change, pallor, rash and wound.     Objective:   Vital Signs BP 140/86 (BP Location: Left Arm, Patient Position: Sitting, Cuff Size: Large)   Pulse 64   Temp 97.7 F (36.5 C) (Temporal)   Ht 5\' 5"  (1.651 m)   Wt 253 lb 3.2 oz (114.9 kg)   SpO2 100%   BMI 42.13 kg/m  Vital Signs and Nursing Note Reviewed  Physical Exam  Constitutional: She is oriented to person, place, and time and well-developed, well-nourished, and in no distress.  HENT:  Head: Normocephalic and atraumatic.  Eyes: EOM are normal.  Pulmonary/Chest: Effort normal.    Bilaterally, incisions healing very well, c/d/i. No signs of infection, redness, drainage, seroma/hematoma.   Musculoskeletal:        General: Normal range of motion.     Cervical back: Normal range of motion.   Neurological: She is alert and oriented to person, place, and time. Gait normal.  Skin: Skin is warm and dry. No rash noted. No erythema. No pallor.  Psychiatric: Mood, memory, affect and judgment normal.      Assessment/Plan:     ICD-10-CM   1. S/P bilateral breast reduction  Z98.890     Monica Spencer is healing well. Incisions c/d/i, bilaterally.   May wear regular bra (no underwire). Does not need to sleep in sports bra any longer unless desired. May resume normal activity, gradually and as tolerated.   Call office with any questions/concerns.  Pictures were obtained of the patient and placed in the chart with the patient's or guardian's permission.  The [place] was signed into law in 2016 which includes the topic of electronic health records.  This provides immediate access to information in MyChart.  This includes consultation notes, operative notes, office notes, lab results and pathology reports.  If you have any questions about what you read please let us know at your next visit or call us at the office.  We are right here with you.   Threasa Heads, PA-C 09/06/2019, 12:28 PM

## 2019-10-12 ENCOUNTER — Ambulatory Visit: Payer: 59 | Attending: Internal Medicine

## 2019-10-12 DIAGNOSIS — Z23 Encounter for immunization: Secondary | ICD-10-CM | POA: Insufficient documentation

## 2019-10-12 NOTE — Progress Notes (Signed)
   Covid-19 Vaccination Clinic  Name:  Monica Spencer    MRN: [id number] DOB: 10-02-1970  10/12/2019  Monica Spencer was observed post Covid-19 immunization for 15 minutes without incidence. She was provided with Vaccine Information Sheet and instruction to access the V-Safe system.   Monica Spencer was instructed to call 911 with any severe reactions post vaccine: Marland Kitchen Difficulty breathing  . Swelling of your face and throat  . A fast heartbeat  . A bad rash all over your body  . Dizziness and weakness    Immunizations Administered    Name Date Dose VIS Date Route   Pfizer COVID-19 Vaccine 10/12/2019  1:25 PM 0.3 mL 07/26/2019 Intramuscular   Manufacturer: [place]   Lot: [id number]   [place]: [id number]

## 2019-11-02 ENCOUNTER — Ambulatory Visit: Payer: 59 | Attending: Internal Medicine

## 2019-11-02 DIAGNOSIS — Z23 Encounter for immunization: Secondary | ICD-10-CM

## 2019-11-02 NOTE — Progress Notes (Signed)
   Covid-19 Vaccination Clinic  Name:  Monica Spencer    MRN: [id number] DOB: [birth year]  11/02/2019  [name] was observed post Covid-19 immunization for 15 minutes without incident. She was provided with Vaccine Information Sheet and instruction to access the V-Safe system.   [name] was instructed to call 911 with any severe reactions post vaccine: Marland Kitchen Difficulty breathing  . Swelling of face and throat  . A fast heartbeat  . A bad rash all over body  . Dizziness and weakness   Immunizations Administered    Name Date Dose VIS Date Route   Pfizer COVID-19 Vaccine 11/02/2019  8:09 AM 0.3 mL 07/26/2019 Intramuscular   Manufacturer: [place]   Lot: [id number]   [place]: [id number]

## 2019-11-16 ENCOUNTER — Other Ambulatory Visit: Payer: Self-pay | Admitting: Family Medicine

## 2019-11-16 DIAGNOSIS — E890 Postprocedural hypothyroidism: Secondary | ICD-10-CM

## 2019-11-18 NOTE — Telephone Encounter (Signed)
Pt needs appointment for further refills 

## 2020-01-29 ENCOUNTER — Other Ambulatory Visit: Payer: Self-pay | Admitting: Family Medicine

## 2020-01-29 DIAGNOSIS — E890 Postprocedural hypothyroidism: Secondary | ICD-10-CM

## 2020-02-02 ENCOUNTER — Other Ambulatory Visit: Payer: Self-pay | Admitting: Family Medicine

## 2020-02-02 DIAGNOSIS — I10 Essential (primary) hypertension: Secondary | ICD-10-CM

## 2020-02-03 NOTE — Telephone Encounter (Signed)
Pt needs appointment for further refills 

## 2020-03-09 ENCOUNTER — Encounter: Payer: 59 | Admitting: Family Medicine

## 2020-03-12 ENCOUNTER — Other Ambulatory Visit: Payer: Self-pay

## 2020-03-12 ENCOUNTER — Encounter: Payer: Self-pay | Admitting: Family Medicine

## 2020-03-12 ENCOUNTER — Ambulatory Visit (INDEPENDENT_AMBULATORY_CARE_PROVIDER_SITE_OTHER): Payer: No Typology Code available for payment source | Admitting: Family Medicine

## 2020-03-12 VITALS — BP 110/78 | HR 55 | Temp 98.1°F | Ht 65.0 in | Wt 217.0 lb

## 2020-03-12 DIAGNOSIS — E890 Postprocedural hypothyroidism: Secondary | ICD-10-CM

## 2020-03-12 DIAGNOSIS — Z0000 Encounter for general adult medical examination without abnormal findings: Secondary | ICD-10-CM

## 2020-03-12 DIAGNOSIS — E782 Mixed hyperlipidemia: Secondary | ICD-10-CM

## 2020-03-12 DIAGNOSIS — Z86010 Personal history of colonic polyps: Secondary | ICD-10-CM

## 2020-03-12 DIAGNOSIS — I10 Essential (primary) hypertension: Secondary | ICD-10-CM

## 2020-03-12 DIAGNOSIS — R634 Abnormal weight loss: Secondary | ICD-10-CM

## 2020-03-12 NOTE — Patient Instructions (Signed)

## 2020-03-12 NOTE — Progress Notes (Signed)
Subjective:     Monica Spencer is a 49 y.o. female and is here for a comprehensive physical exam. Pt states she has been doing well.  Pt s/p breast reduction surgery and removal of R breast ductal papilloma in September.  Went from a size K cup to a D cup bra.  Pt feels so much better as 10 pounds were removed from her chest.  Pt started [employer] weight loss program February 1.  Receives meals from the company- eats more vegetables, eats 5 small meals per day, and drinks more water.  She is hoping that she can maintain this without buying the products.  A box of food for the month is $450.  Pt weighed 231 pounds then, now 217.  Pt hopes to get under 200.  Pt needs to schedule colonoscopy.  Last done 2017 in [place].  Pt was on the every 5-year plan then moved to every 3 years.  Pt notes family history of colon disease.  Social History   Socioeconomic History  . Marital status: Married    Spouse name: Not on file  . Number of children: Not on file  . Years of education: Not on file  . Highest education level: Not on file  Occupational History  . Not on file  Tobacco Use  . Smoking status: Never Smoker  . Smokeless tobacco: Never Used  Vaping Use  . Vaping Use: Never used  Substance and Sexual Activity  . Alcohol use: Never  . Drug use: Never  . Sexual activity: Not on file  Other Topics Concern  . Not on file  Social History Narrative  . Not on file   Social Determinants of Health   Financial Resource Strain:   . Difficulty of Paying Living Expenses:   Food Insecurity:   . Worried About Charity fundraiser in the Last Year:   . Arboriculturist in the Last Year:   Transportation Needs:   . Film/video editor (Medical):   Marland Kitchen Lack of Transportation (Non-Medical):   Physical Activity:   . Days of Exercise per Week:   . Minutes of Exercise per Session:   Stress:   . Feeling of Stress :   Social Connections:   . Frequency of Communication with Friends and  Family:   . Frequency of Social Gatherings with Friends and Family:   . Attends Religious Services:   . Active Member of Clubs or Organizations:   . Attends Archivist Meetings:   Marland Kitchen Marital Status:   Intimate Partner Violence:   . Fear of Current or Ex-Partner:   . Emotionally Abused:   Marland Kitchen Physically Abused:   . Sexually Abused:    Health Maintenance  Topic Date Due  . Hepatitis C Screening  Never done  . HIV Screening  Never done  . TETANUS/TDAP  Never done  . INFLUENZA VACCINE  03/15/2020  . MAMMOGRAM  02/24/2021  . PAP SMEAR-Modifier  02/26/2022  . COLONOSCOPY  01/27/2026  . COVID-19 Vaccine  Completed    The following portions of the patient's history were reviewed and updated as appropriate: allergies, current medications, past family history, past medical history, past social history, past surgical history and problem list.  Review of Systems Pertinent items noted in HPI and remainder of comprehensive ROS otherwise negative.   Objective:    BP 110/78 (BP Location: Left Arm, Patient Position: Sitting, Cuff Size: Large)   Pulse 55   Temp 98.1 F (36.7 C) (Oral)  Ht _0  (1.651 m)   Wt (!) 217 lb (98.4 kg)   LMP 11/19/2019 (Exact Date)   SpO2 98%   BMI 36.11 kg/m  General appearance: alert, cooperative and no distress Head: Normocephalic, without obvious abnormality, atraumatic Eyes: conjunctivae/corneas clear. PERRL, EOM's intact. Fundi benign. Ears: normal TM's and external ear canals both ears Nose: Nares normal. Septum midline. Mucosa normal. No drainage or sinus tenderness. Throat: lips, mucosa, and tongue normal; teeth and gums normal Neck: no adenopathy, no carotid bruit, no JVD, supple, symmetrical, trachea midline and thyroid not enlarged, symmetric, no tenderness/mass/nodules Lungs: clear to auscultation bilaterally Heart: regular rate and rhythm, S1, S2 normal, no murmur, click, rub or gallop Abdomen: soft, non-tender; bowel sounds normal; no  masses,  no organomegaly Extremities: extremities normal, atraumatic, no cyanosis or edema Pulses: 2+ and symmetric Skin: Skin color, texture, turgor normal. No rashes or lesions Lymph nodes: Cervical, supraclavicular, and axillary nodes normal. Neurologic: Alert and oriented X 3, normal strength and tone. Normal symmetric reflexes. Normal coordination and gait    Assessment:    Healthy female exam.      Plan:     Anticipatory guidance given including wearing seatbelts, smoke detectors in the home, increasing physical activity, increasing p.o. intake of water and vegetables. -will obtain labs -Mammogram to be scheduled -Colonoscopy due -Pap up-to-date per patient -Given handout -Next CPE in 1 year See After Visit Summary for Counseling Recommendations    Weight loss  -Continue lifestyle modifications -encouraged to increase physical activity - Plan: CBC (no diff), CMP with eGFR([facility]), TSH, Hemoglobin A1c, Vitamin D, 25-hydroxy  Essential hypertension  -Controlled -Continue lifestyle modifications -Discussed holding atenolol 25 mg daily.  If BP consistently greater than 130/90 restart - Plan: CMP with eGFR([facility]), CMP with eGFR([facility])  Mixed hyperlipidemia  -Discussed lifestyle modifications - Plan: Lipid panel  Postsurgical hypothyroidism -Continue Synthroid 100 mcg daily - Plan: TSH  History of colon polyps  -Last colonoscopy 01/28/2016, repeat in 3 years recommended 2/2 polyps - Plan: Ambulatory referral to Gastroenterology  Follow-up as needed  Grier Mitts, MD

## 2020-03-13 LAB — COMPLETE METABOLIC PANEL WITH GFR
AG Ratio: 1.3 (calc) (ref 1.0–2.5)
ALT: 17 U/L (ref 6–29)
AST: 15 U/L (ref 10–35)
Albumin: 4 g/dL (ref 3.6–5.1)
Alkaline phosphatase (APISO): 59 U/L (ref 37–153)
BUN: 16 mg/dL (ref 7–25)
CO2: 27 mmol/L (ref 20–32)
Calcium: 9.1 mg/dL (ref 8.6–10.4)
Chloride: 107 mmol/L (ref 98–110)
Creat: 0.98 mg/dL (ref 0.50–1.05)
GFR, Est African American: 77 mL/min/{1.73_m2} (ref >=60)
GFR, Est Non African American: 67 mL/min/{1.73_m2} (ref >=60)
Globulin: 3.1 g/dL (calc) (ref 1.9–3.7)
Glucose, Bld: 84 mg/dL (ref 65–99)
Potassium: 4.1 mmol/L (ref 3.5–5.3)
Sodium: 142 mmol/L (ref 135–146)
Total Bilirubin: 0.5 mg/dL (ref 0.2–1.2)
Total Protein: 7.1 g/dL (ref 6.1–8.1)

## 2020-03-13 LAB — CBC
HCT: 35.1 % (ref 35.0–45.0)
Hemoglobin: 11.3 g/dL — ABNORMAL LOW (ref 11.7–15.5)
MCH: 28.3 pg (ref 27.0–33.0)
MCHC: 32.2 g/dL (ref 32.0–36.0)
MCV: 88 fL (ref 80.0–100.0)
MPV: 11.2 fL (ref 7.5–12.5)
Platelets: 370 10*3/uL (ref 140–400)
RBC: 3.99 10*6/uL (ref 3.80–5.10)
RDW: 12.5 % (ref 11.0–15.0)
WBC: 3.8 10*3/uL (ref 3.8–10.8)

## 2020-03-13 LAB — LIPID PANEL
Cholesterol: 207 mg/dL — ABNORMAL HIGH (ref <200)
HDL: 45 mg/dL — ABNORMAL LOW (ref >=50)
LDL Cholesterol (Calc): 140 mg/dL (calc) — ABNORMAL HIGH
Non-HDL Cholesterol (Calc): 162 mg/dL (calc) — ABNORMAL HIGH (ref <130)
Total CHOL/HDL Ratio: 4.6 (calc) (ref <5.0)
Triglycerides: 103 mg/dL (ref <150)

## 2020-03-13 LAB — HEMOGLOBIN A1C
Hgb A1c MFr Bld: 5.7 % of total Hgb — ABNORMAL HIGH (ref <5.7)
Mean Plasma Glucose: 117 (calc)
eAG (mmol/L): 6.5 (calc)

## 2020-03-13 LAB — TSH: TSH: 0.82 mIU/L

## 2020-03-13 LAB — VITAMIN D 25 HYDROXY (VIT D DEFICIENCY, FRACTURES): Vit D, 25-Hydroxy: 82 ng/mL (ref 30–100)

## 2020-03-16 ENCOUNTER — Encounter: Payer: Self-pay | Admitting: Family Medicine

## 2020-03-16 MED ORDER — LEVOTHYROXINE SODIUM 100 MCG PO TABS: 100.0000 ug | ORAL_TABLET | Freq: Every day | ORAL | 3 refills | Status: DC

## 2020-03-19 ENCOUNTER — Encounter: Payer: Self-pay | Admitting: Internal Medicine

## 2020-04-25 ENCOUNTER — Other Ambulatory Visit: Payer: Self-pay | Admitting: Family Medicine

## 2020-04-25 DIAGNOSIS — I10 Essential (primary) hypertension: Secondary | ICD-10-CM

## 2020-05-22 ENCOUNTER — Encounter: Payer: No Typology Code available for payment source | Admitting: Internal Medicine

## 2020-06-12 ENCOUNTER — Ambulatory Visit: Payer: No Typology Code available for payment source | Admitting: Family Medicine

## 2020-06-15 ENCOUNTER — Encounter: Payer: Self-pay | Admitting: Family Medicine

## 2020-06-15 ENCOUNTER — Other Ambulatory Visit: Payer: Self-pay

## 2020-06-15 ENCOUNTER — Ambulatory Visit: Payer: No Typology Code available for payment source | Admitting: Family Medicine

## 2020-06-15 VITALS — BP 145/94 | HR 85 | Temp 98.7°F | Wt 214.0 lb

## 2020-06-15 DIAGNOSIS — I10 Essential (primary) hypertension: Secondary | ICD-10-CM | POA: Diagnosis not present

## 2020-06-15 MED ORDER — ATENOLOL 25 MG PO TABS: 25.0000 mg | ORAL_TABLET | Freq: Every day | ORAL | 1 refills | Status: DC

## 2020-06-15 NOTE — Progress Notes (Signed)
Subjective:    Patient ID: Monica Spencer, female    DOB: 1971-04-08, 49 y.o.   MRN: [id number]  No chief complaint on file.   HPI Pt is a 49 year old female with past medical history significant for HTN, HLD, postoperative hypothyroidism, h/o prediabetes,  S/p b/l breast reduction, h/o ductal papilloma right breast who was seen for follow-up on BP.  Pt held atenolol 25 mg daily after BP had improved from last visit.  Patient notes she stopped checking her BP at home as her monitor was giving her extremely elevated readings 1 day after her office visit on 03/12/2020.  Reading were 180/90.  Patient denies headache, SOB, CP, changes in vision.  Pt notes her glasses rx has decreased and she gets new glasses this wk.  Patient has been continuing opti-via and exercising, losing weight.  Trying to increase intake of water slightly 60 ounces per day.  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  Past Medical History:  Diagnosis Date  . Hypertension   . Thyroid disease     No Known Allergies  ROS General: Denies fever, chills, night sweats, changes in weight, changes in appetite HEENT: Denies headaches, ear pain, changes in vision, rhinorrhea, sore throat CV: Denies CP, palpitations, SOB, orthopnea Pulm: Denies SOB, cough, wheezing GI: Denies abdominal pain, nausea, vomiting, diarrhea, constipation GU: Denies dysuria, hematuria, frequency, vaginal discharge Msk: Denies muscle cramps, joint pains Neuro: Denies weakness, numbness, tingling Skin: Denies rashes, bruising Psych: Denies depression, anxiety, hallucinations      Objective:    Blood pressure 134/80, pulse 85, temperature 98.7 F (37.1 C), temperature source Oral, weight 214 lb (97.1 kg), SpO2 98 %.   BP recheck: 148/92 in right arm and 145/94 on left arm  Gen. Pleasant, well-nourished, in no distress, normal affect   HEENT: [place]/AT, face symmetric, conjunctiva clear, no scleral icterus, PERRLA, EOMI, nares  patent without drainage Lungs: no accessory muscle use Cardiovascular: RRR,  no peripheral edema Musculoskeletal: No deformities, no cyanosis or clubbing, normal tone Neuro:  A&Ox3, CN II-XII intact, normal gait Skin:  Warm, no lesions/ rash   Wt Readings from Last 3 Encounters:  06/15/20 214 lb (97.1 kg)  03/12/20 (!) 217 lb (98.4 kg)  09/06/19 253 lb 3.2 oz (114.9 kg)    Lab Results  Component Value Date   WBC 3.8 03/12/2020   HGB 11.3 (L) 03/12/2020   HCT 35.1 03/12/2020   PLT 370 03/12/2020   GLUCOSE 84 03/12/2020   CHOL 207 (H) 03/12/2020   TRIG 103 03/12/2020   HDL 45 (L) 03/12/2020   LDLCALC 140 (H) 03/12/2020   ALT 17 03/12/2020   AST 15 03/12/2020   NA 142 03/12/2020   K 4.1 03/12/2020   CL 107 03/12/2020   CREATININE 0.98 03/12/2020   BUN 16 03/12/2020   CO2 27 03/12/2020   TSH 0.82 03/12/2020   HGBA1C 5.7 (H) 03/12/2020    Assessment/Plan:  Essential hypertension -Elevated -BP rechecked by this provider.  Right arm 148/92.  Left arm 145/94 -Discussed restarting atenolol 25 mg daily. -Patient encouraged to obtain a new BP cuff for home monitoring and keep a log/bring her cuff with her to clinic -Continue lifestyle modifications including exercise -Given precautions - Plan: atenolol (TENORMIN) 25 MG tablet  F/u 1-2 months, sooner if needed  Grier Mitts, MD

## 2020-08-03 ENCOUNTER — Other Ambulatory Visit: Payer: Self-pay

## 2020-08-03 ENCOUNTER — Encounter: Payer: Self-pay | Admitting: Family Medicine

## 2020-08-03 ENCOUNTER — Ambulatory Visit: Payer: No Typology Code available for payment source | Admitting: Family Medicine

## 2020-08-03 VITALS — BP 128/80 | HR 87 | Temp 98.1°F | Wt 219.6 lb

## 2020-08-03 DIAGNOSIS — I10 Essential (primary) hypertension: Secondary | ICD-10-CM | POA: Diagnosis not present

## 2020-08-03 NOTE — Progress Notes (Signed)
Subjective:    Patient ID: Monica Spencer, female    DOB: 05-23-1971, 49 y.o.   MRN: [id number]  No chief complaint on file.   HPI Patient was seen today for follow-up on HTN.  Patient states she checked her BP briefly after last OFV and it was elevated, but has not checked it since.  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  Patient doing exercise video a few times per week and using [employer] program.  Patient taking atenolol 25 mg daily.  Patient denies headaches, changes in vision, CP.  Past Medical History:  Diagnosis Date  . Hypertension   . Thyroid disease     No Known Allergies  ROS General: Denies fever, chills, night sweats, changes in weight, changes in appetite HEENT: Denies headaches, ear pain, changes in vision, rhinorrhea, sore throat CV: Denies CP, palpitations, SOB, orthopnea Pulm: Denies SOB, cough, wheezing GI: Denies abdominal pain, nausea, vomiting, diarrhea, constipation GU: Denies dysuria, hematuria, frequency, vaginal discharge Msk: Denies muscle cramps, joint pains Neuro: Denies weakness, numbness, tingling Skin: Denies rashes, bruising Psych: Denies depression, anxiety, hallucinations      Objective:    Blood pressure 128/80, pulse 87, temperature 98.1 F (36.7 C), temperature source Oral, weight 219 lb 9.6 oz (99.6 kg), SpO2 99 %.   Gen. Pleasant, well-nourished, in no distress, normal affect  HEENT: [place]/AT, face symmetric, conjunctiva clear, no scleral icterus, PERRLA, EOMI, nares patent without drainage Lungs: no accessory muscle use Cardiovascular: RRR, no peripheral edema Musculoskeletal: No deformities, no cyanosis or clubbing, normal tone Neuro:  A&Ox3, CN II-XII intact, normal gait Skin:  Warm, no lesions/ rash   Wt Readings from Last 3 Encounters:  08/03/20 219 lb 9.6 oz (99.6 kg)  06/15/20 214 lb (97.1 kg)  03/12/20 (!) 217 lb (98.4 kg)    Lab Results  Component Value  Date   WBC 3.8 03/12/2020   HGB 11.3 (L) 03/12/2020   HCT 35.1 03/12/2020   PLT 370 03/12/2020   GLUCOSE 84 03/12/2020   CHOL 207 (H) 03/12/2020   TRIG 103 03/12/2020   HDL 45 (L) 03/12/2020   LDLCALC 140 (H) 03/12/2020   ALT 17 03/12/2020   AST 15 03/12/2020   NA 142 03/12/2020   K 4.1 03/12/2020   CL 107 03/12/2020   CREATININE 0.98 03/12/2020   BUN 16 03/12/2020   CO2 27 03/12/2020   TSH 0.82 03/12/2020   HGBA1C 5.7 (H) 03/12/2020    Assessment/Plan:  Essential hypertension -Controlled -Discussed the importance of continuing lifestyle modifications -Continue atenolol 25 mg daily -Patient encouraged to check BP at home and keep a log to bring with her to clinic.  F/u 3-4 months, sooner if needed  Grier Mitts, MD

## 2020-08-03 NOTE — Patient Instructions (Signed)
DASH Eating Plan DASH stands for "Dietary Approaches to Stop Hypertension." The DASH eating plan is a healthy eating plan that has been shown to reduce high blood pressure (hypertension). It may also reduce your risk for type 2 diabetes, heart disease, and stroke. The DASH eating plan may also help with weight loss. What are tips for following this plan?  General guidelines  Avoid eating more than 2,300 mg (milligrams) of salt (sodium) a day. If you have hypertension, you may need to reduce your sodium intake to 1,500 mg a day.  Limit alcohol intake to no more than 1 drink a day for nonpregnant women and 2 drinks a day for men. One drink equals 12 oz of beer, 5 oz of wine, or 1 oz of hard liquor.  Work with your health care provider to maintain a healthy body weight or to lose weight. Ask what an ideal weight is for you.  Get at least 30 minutes of exercise that causes your heart to beat faster (aerobic exercise) most days of the week. Activities may include walking, swimming, or biking.  Work with your health care provider or diet and nutrition specialist (dietitian) to adjust your eating plan to your individual calorie needs. Reading food labels   Check food labels for the amount of sodium per serving. Choose foods with less than 5 percent of the Daily Value of sodium. Generally, foods with less than 300 mg of sodium per serving fit into this eating plan.  To find whole grains, look for the word "whole" as the first word in the ingredient list. Shopping  Buy products labeled as "low-sodium" or "no salt added."  Buy fresh foods. Avoid canned foods and premade or frozen meals. Cooking  Avoid adding salt when cooking. Use salt-free seasonings or herbs instead of table salt or sea salt. Check with your health care provider or pharmacist before using salt substitutes.  Do not fry foods. Cook foods using healthy methods such as baking, boiling, grilling, and broiling instead.  Cook with  heart-healthy oils, such as olive, canola, soybean, or sunflower oil. Meal planning  Eat a balanced diet that includes: ? 5 or more servings of fruits and vegetables each day. At each meal, try to fill half of your plate with fruits and vegetables. ? Up to 6-8 servings of whole grains each day. ? Less than 6 oz of lean meat, poultry, or fish each day. A 3-oz serving of meat is about the same size as a deck of cards. One egg equals 1 oz. ? 2 servings of low-fat dairy each day. ? A serving of nuts, seeds, or beans 5 times each week. ? Heart-healthy fats. Healthy fats called Omega-3 fatty acids are found in foods such as flaxseeds and coldwater fish, like sardines, salmon, and mackerel.  Limit how much you eat of the following: ? Canned or prepackaged foods. ? Food that is high in trans fat, such as fried foods. ? Food that is high in saturated fat, such as fatty meat. ? Sweets, desserts, sugary drinks, and other foods with added sugar. ? Full-fat dairy products.  Do not salt foods before eating.  Try to eat at least 2 vegetarian meals each week.  Eat more home-cooked food and less restaurant, buffet, and fast food.  When eating at a restaurant, ask that your food be prepared with less salt or no salt, if possible. What foods are recommended? The items listed may not be a complete list. Talk with your dietitian about   what dietary choices are best for you. Grains Whole-grain or whole-wheat bread. Whole-grain or whole-wheat pasta. Brown rice. Oatmeal. Quinoa. Bulgur. Whole-grain and low-sodium cereals. Pita bread. Low-fat, low-sodium crackers. Whole-wheat flour tortillas. Vegetables Fresh or frozen vegetables (raw, steamed, roasted, or grilled). Low-sodium or reduced-sodium tomato and vegetable juice. Low-sodium or reduced-sodium tomato sauce and tomato paste. Low-sodium or reduced-sodium canned vegetables. Fruits All fresh, dried, or frozen fruit. Canned fruit in natural juice (without  added sugar). Meat and other protein foods Skinless chicken or turkey. Ground chicken or turkey. Pork with fat trimmed off. Fish and seafood. Egg whites. Dried beans, peas, or lentils. Unsalted nuts, nut butters, and seeds. Unsalted canned beans. Lean cuts of beef with fat trimmed off. Low-sodium, lean deli meat. Dairy Low-fat (1%) or fat-free (skim) milk. Fat-free, low-fat, or reduced-fat cheeses. Nonfat, low-sodium ricotta or cottage cheese. Low-fat or nonfat yogurt. Low-fat, low-sodium cheese. Fats and oils Soft margarine without trans fats. Vegetable oil. Low-fat, reduced-fat, or light mayonnaise and salad dressings (reduced-sodium). Canola, safflower, olive, soybean, and sunflower oils. Avocado. Seasoning and other foods Herbs. Spices. Seasoning mixes without salt. Unsalted popcorn and pretzels. Fat-free sweets. What foods are not recommended? The items listed may not be a complete list. Talk with your dietitian about what dietary choices are best for you. Grains Baked goods made with fat, such as croissants, muffins, or some breads. Dry pasta or rice meal packs. Vegetables Creamed or fried vegetables. Vegetables in a cheese sauce. Regular canned vegetables (not low-sodium or reduced-sodium). Regular canned tomato sauce and paste (not low-sodium or reduced-sodium). Regular tomato and vegetable juice (not low-sodium or reduced-sodium). Pickles. Olives. Fruits Canned fruit in a light or heavy syrup. Fried fruit. Fruit in cream or butter sauce. Meat and other protein foods Fatty cuts of meat. Ribs. Fried meat. Bacon. Sausage. Bologna and other processed lunch meats. Salami. Fatback. Hotdogs. Bratwurst. Salted nuts and seeds. Canned beans with added salt. Canned or smoked fish. Whole eggs or egg yolks. Chicken or turkey with skin. Dairy Whole or 2% milk, cream, and half-and-half. Whole or full-fat cream cheese. Whole-fat or sweetened yogurt. Full-fat cheese. Nondairy creamers. Whipped toppings.  Processed cheese and cheese spreads. Fats and oils Butter. Stick margarine. Lard. Shortening. Ghee. Bacon fat. Tropical oils, such as coconut, palm kernel, or palm oil. Seasoning and other foods Salted popcorn and pretzels. Onion salt, garlic salt, seasoned salt, table salt, and sea salt. Worcestershire sauce. Tartar sauce. Barbecue sauce. Teriyaki sauce. Soy sauce, including reduced-sodium. Steak sauce. Canned and packaged gravies. Fish sauce. Oyster sauce. Cocktail sauce. Horseradish that you find on the shelf. Ketchup. Mustard. Meat flavorings and tenderizers. Bouillon cubes. Hot sauce and Tabasco sauce. Premade or packaged marinades. Premade or packaged taco seasonings. Relishes. Regular salad dressings. Where to find more information:  National Heart, Lung, and Blood Institute: www.nhlbi.nih.gov  American Heart Association: www.heart.org Summary  The DASH eating plan is a healthy eating plan that has been shown to reduce high blood pressure (hypertension). It may also reduce your risk for type 2 diabetes, heart disease, and stroke.  With the DASH eating plan, you should limit salt (sodium) intake to 2,300 mg a day. If you have hypertension, you may need to reduce your sodium intake to 1,500 mg a day.  When on the DASH eating plan, aim to eat more fresh fruits and vegetables, whole grains, lean proteins, low-fat dairy, and heart-healthy fats.  Work with your health care provider or diet and nutrition specialist (dietitian) to adjust your eating plan to your   individual calorie needs. This information is not intended to replace advice given to you by your health care provider. Make sure you discuss any questions you have with your health care provider. Document Revised: 07/14/2017 Document Reviewed: 07/25/2016 Elsevier Patient Education  2020 [employer]  

## 2020-09-11 ENCOUNTER — Encounter: Payer: Self-pay | Admitting: Family Medicine

## 2020-11-03 IMAGING — MG MM PLC BREAST LOC DEV 1ST LESION INC*R*
8 of 13 series · 8 of 13 positions shown · non-contrast
Comparison: Previous exam(s).

CLINICAL DATA: Patient for preoperative localization of right
breast intraductal papilloma 1 o'clock position and residual right
breast calcifications upper right breast at the site of biopsied
papilloma. Note the X shaped marking clip had migrated after the
stereotactic biopsy procedure and therefore the residual
calcifications were localized.

EXAM:
MAMMOGRAPHIC GUIDED RADIOACTIVE SEED LOCALIZATION OF THE RIGHT
BREAST

[R ML (1 of 5)]
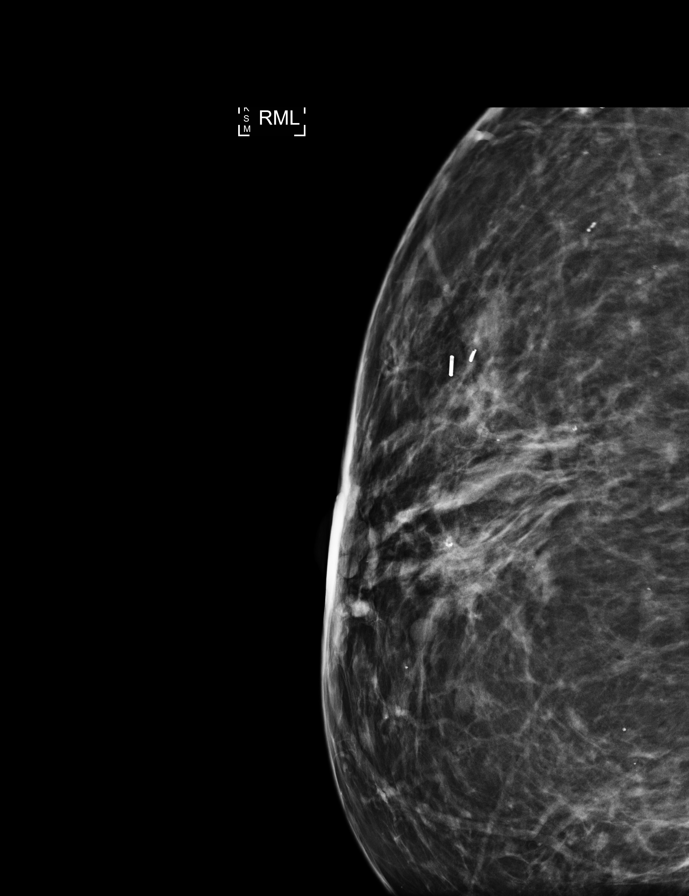

[R ML (2 of 5)]
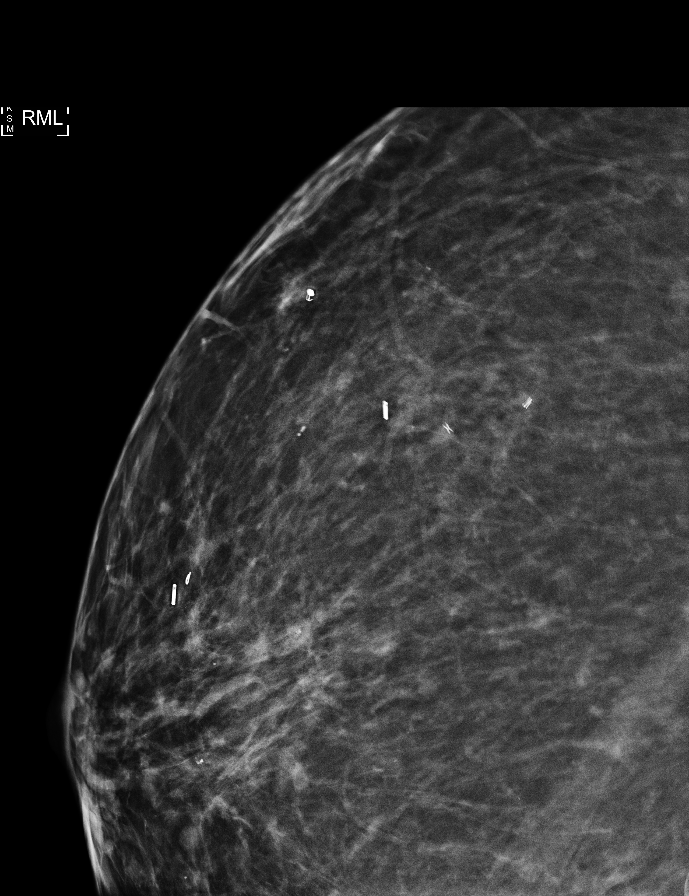

[R ML (3 of 5)]
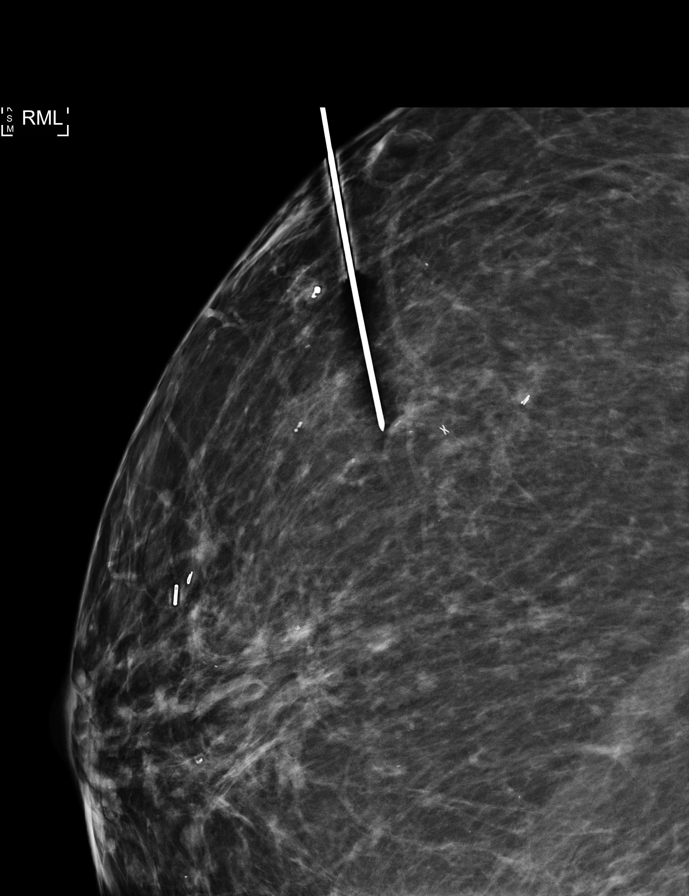

[R ML (4 of 5)]
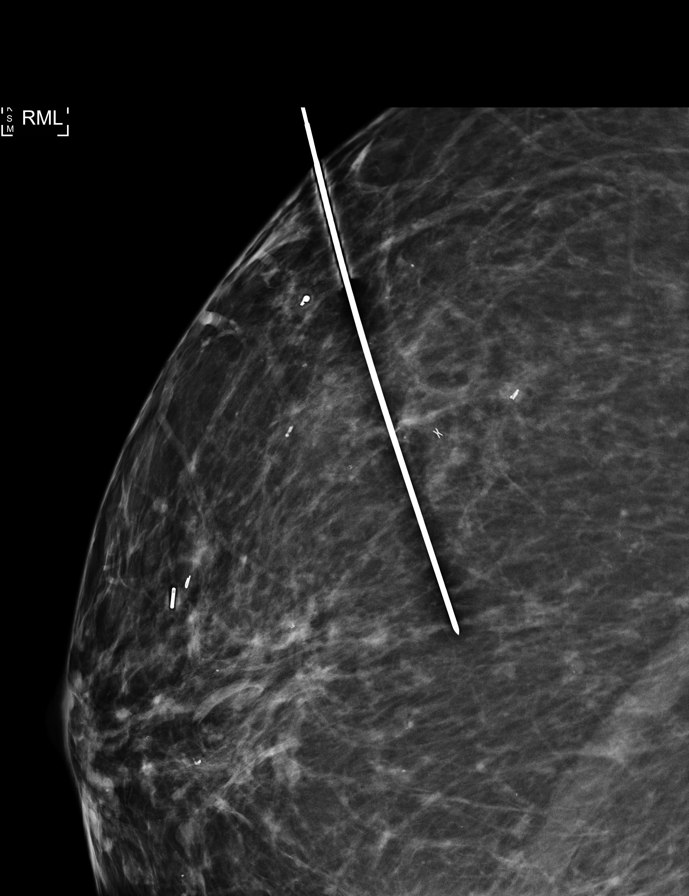

[R ML (5 of 5)]
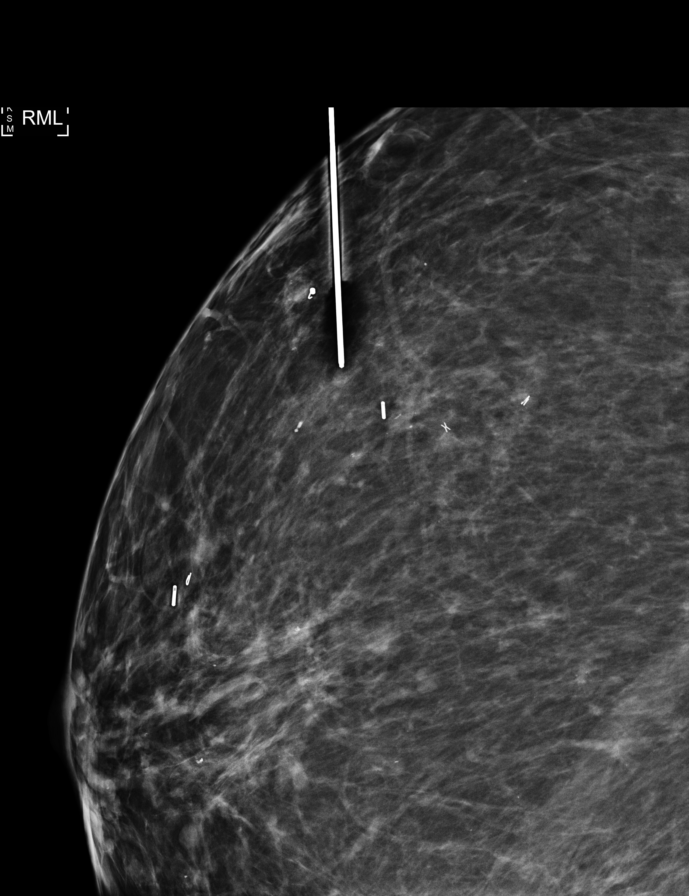

[R CC (1 of 3)]
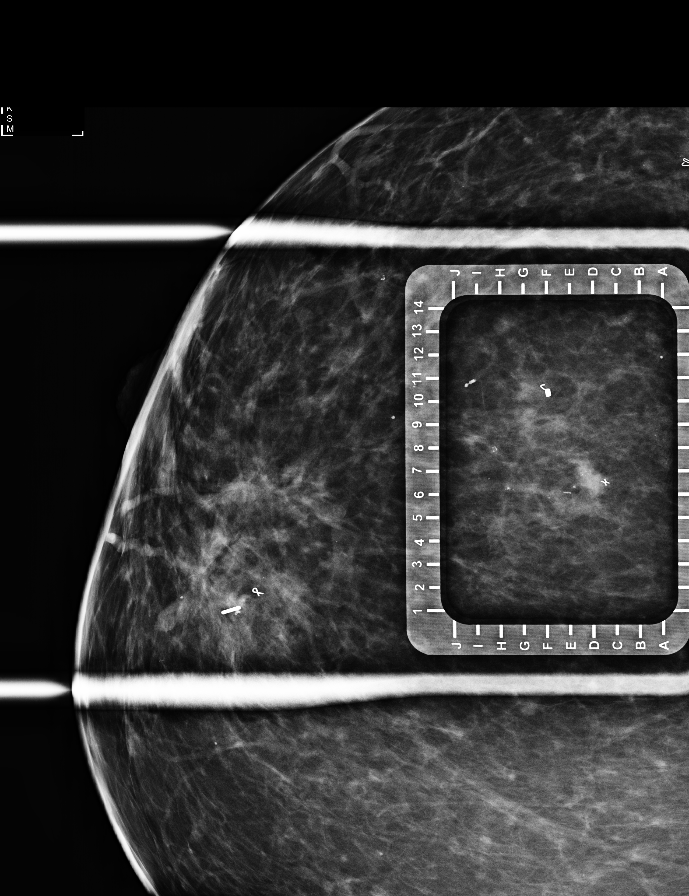

[R CC (2 of 3)]
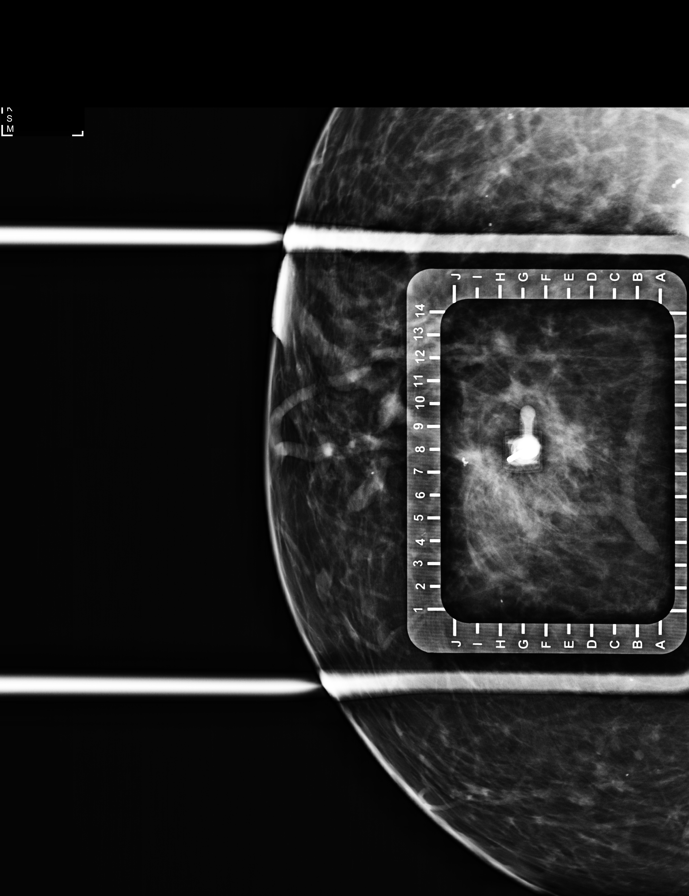

[R CC (3 of 3)]
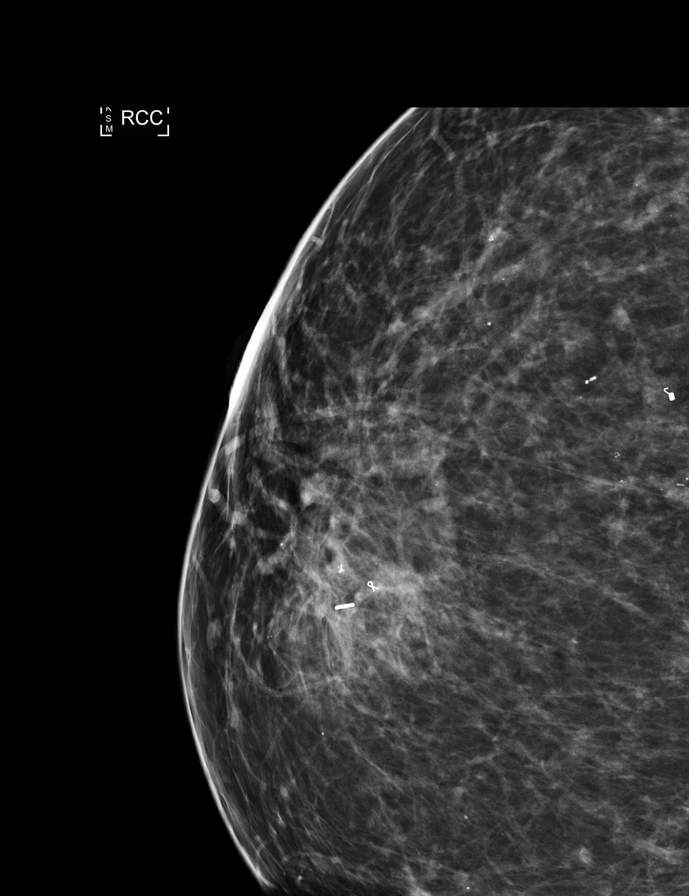

[8 of 13 positions shown; findings below may reference images not displayed]

FINDINGS: Patient presents for radioactive seed localization prior to . I met
with the patient and we discussed the procedure of seed localization
including benefits and alternatives. We discussed the high
likelihood of a successful procedure. We discussed the risks of the
procedure including infection, bleeding, tissue injury and further
surgery. We discussed the low dose of radioactivity involved in the
procedure. Informed, written consent was given.

The usual time-out protocol was performed immediately prior to the
procedure.

Site 1: Ribbon shaped marking clip: 1 o'clock position.

Using mammographic guidance, sterile technique, 1% lidocaine and an
D-UUE radioactive seed, ribbon shaped marking clip within the
intraductal mass was localized using a cranial approach. The
follow-up mammogram images confirm the seed in the expected location
and were marked for Dr. Billie.

Follow-up survey of the patient confirms presence of the radioactive
seed.

Order number of D-UUE seed:  [id number].

Total activity:  0.250 millicuries reference Date: 04/25/2019

Site 2: Residual calcifications at the site of biopsied papilloma.

Using mammographic guidance, sterile technique, 1% lidocaine and an
D-UUE radioactive seed, residual right breast calcifications were
localized using a cranial approach. The follow-up mammogram images
confirm the seed in the expected location and were marked for Dr.
Billie.

Follow-up survey of the patient confirms presence of the radioactive
seed.

Order number of D-UUE seed:  [id number].

Total activity:  0.253 millicuries reference Date: April 02, 2019

The patient tolerated the procedure well and was released from the
[REDACTED]. She was given instructions regarding seed removal.
IMPRESSION: Radioactive seed localization right breast. No apparent
complications.

Site 1: Ribbon shaped clip.

Site 2: Residual calcifications.

## 2020-11-27 ENCOUNTER — Ambulatory Visit: Payer: No Typology Code available for payment source | Admitting: Family Medicine

## 2020-11-30 ENCOUNTER — Ambulatory Visit: Payer: No Typology Code available for payment source | Admitting: Family Medicine

## 2020-12-23 ENCOUNTER — Telehealth: Payer: Self-pay | Admitting: Internal Medicine

## 2020-12-23 NOTE — Telephone Encounter (Signed)
Pt will be faxing colon report from 2017 (path in Epic) for Dr. Celesta Aver review. She stated that she is over due for colon.

## 2021-01-19 ENCOUNTER — Other Ambulatory Visit: Payer: Self-pay

## 2021-01-20 ENCOUNTER — Other Ambulatory Visit: Payer: Self-pay | Admitting: Family Medicine

## 2021-01-20 ENCOUNTER — Ambulatory Visit: Payer: No Typology Code available for payment source | Admitting: Family Medicine

## 2021-01-20 ENCOUNTER — Encounter: Payer: Self-pay | Admitting: Family Medicine

## 2021-01-20 VITALS — BP 126/86 | HR 67 | Temp 98.0°F | Wt 228.8 lb

## 2021-01-20 DIAGNOSIS — I10 Essential (primary) hypertension: Secondary | ICD-10-CM

## 2021-01-20 NOTE — Progress Notes (Signed)
Subjective:    Patient ID: Monica Spencer, female    DOB: 1971-04-02, 50 y.o.   MRN: [id number]  Chief Complaint  Patient presents with  . Follow-up    BP    HPI Patient was seen today for follow-up on HTN.  Patient taking atenolol 25 mg daily.  States BP at home 120s/80s-90s.  Patient denies increased stress or sodium intake.  Patient drinking water daily.  Denies headaches, chest pain, changes in vision.  Patient is looking forward to going to [place] with her daughters dance competition  Past Medical History:  Diagnosis Date  . Hypertension   . Thyroid disease     No Known Allergies  ROS General: Denies fever, chills, night sweats, changes in weight, changes in appetite HEENT: Denies headaches, ear pain, changes in vision, rhinorrhea, sore throat CV: Denies CP, palpitations, SOB, orthopnea Pulm: Denies SOB, cough, wheezing GI: Denies abdominal pain, nausea, vomiting, diarrhea, constipation GU: Denies dysuria, hematuria, frequency, vaginal discharge Msk: Denies muscle cramps, joint pains Neuro: Denies weakness, numbness, tingling Skin: Denies rashes, bruising Psych: Denies depression, anxiety, hallucinations     Objective:    Blood pressure 126/86, pulse 67, temperature 98 F (36.7 C), temperature source Oral, weight 228 lb 12.8 oz (103.8 kg), SpO2 97 %.  Gen. Pleasant, well-nourished, in no distress, normal affect   HEENT: [place]/AT, face symmetric, wearing glasses, conjunctiva clear, no scleral icterus, PERRLA, EOMI, nares patent without drainage] Lungs: no accessory muscle use Cardiovascular: RRR,no peripheral edema Neuro:  A&Ox3, CN II-XII intact, normal gait Skin:  Warm, no lesions/ rash   Wt Readings from Last 3 Encounters:  01/20/21 228 lb 12.8 oz (103.8 kg)  08/03/20 219 lb 9.6 oz (99.6 kg)  06/15/20 214 lb (97.1 kg)    Lab Results  Component Value Date   WBC 3.8 03/12/2020   HGB 11.3 (L) 03/12/2020   HCT 35.1 03/12/2020   PLT 370 03/12/2020    GLUCOSE 84 03/12/2020   CHOL 207 (H) 03/12/2020   TRIG 103 03/12/2020   HDL 45 (L) 03/12/2020   LDLCALC 140 (H) 03/12/2020   ALT 17 03/12/2020   AST 15 03/12/2020   NA 142 03/12/2020   K 4.1 03/12/2020   CL 107 03/12/2020   CREATININE 0.98 03/12/2020   BUN 16 03/12/2020   CO2 27 03/12/2020   TSH 0.82 03/12/2020   HGBA1C 5.7 (H) 03/12/2020    Assessment/Plan:  Essential hypertension -Controlled -Continue atenolol 25 mg daily -Continue lifestyle modifications -Patient to bring BP monitor to next OFV for comparison -Atenolol refilled  F/u on 03/19/2021 for CPE, sooner if needed  Grier Mitts, MD

## 2021-01-27 ENCOUNTER — Encounter: Payer: Self-pay | Admitting: Internal Medicine

## 2021-01-27 ENCOUNTER — Telehealth: Payer: Self-pay | Admitting: Internal Medicine

## 2021-01-27 NOTE — Telephone Encounter (Signed)
Colon sch'ed 03/23/21 at 3:00pm at [facility].

## 2021-01-27 NOTE — Telephone Encounter (Signed)
June 2017 colonoscopy with 2 mm adenoma.  Brother had colon cancer in his 49s.  Agree with scheduling colonoscopy.  Please arrange.  I did send her a MyChart message that we would be contacting her.

## 2021-01-27 NOTE — Telephone Encounter (Signed)
This encounter was created in error - please disregard.

## 2021-03-04 ENCOUNTER — Other Ambulatory Visit: Payer: Self-pay | Admitting: Obstetrics and Gynecology

## 2021-03-04 DIAGNOSIS — N6452 Nipple discharge: Secondary | ICD-10-CM

## 2021-03-09 ENCOUNTER — Ambulatory Visit (AMBULATORY_SURGERY_CENTER): Payer: No Typology Code available for payment source

## 2021-03-09 VITALS — Ht 65.0 in | Wt 235.7 lb

## 2021-03-09 DIAGNOSIS — Z86010 Personal history of colonic polyps: Secondary | ICD-10-CM

## 2021-03-09 DIAGNOSIS — Z800 Family history of malignant neoplasm of digestive organs: Secondary | ICD-10-CM

## 2021-03-09 NOTE — Progress Notes (Signed)
No egg or soy allergy known to patient  No issues with past sedation with any surgeries or procedures Patient denies ever being told they had issues or difficulty with intubation  No FH of Malignant Hyperthermia No diet pills per patient No home 02 use per patient  No blood thinners per patient  Pt denies issues with constipation  No A fib or A flutter  EMMI video to pt or via [place] 19 guidelines implemented in PV today with Pt and RN    Miralax prep  Due to the COVID-19 pandemic we are asking patients to follow certain guidelines.  Pt aware of COVID protocols and [facility] guidelines

## 2021-03-18 ENCOUNTER — Ambulatory Visit
Admission: RE | Admit: 2021-03-18 | Discharge: 2021-03-18 | Disposition: A | Discharge status: Home / Self Care | Payer: No Typology Code available for payment source | Source: Ambulatory Visit | Attending: Obstetrics and Gynecology | Admitting: Obstetrics and Gynecology

## 2021-03-18 ENCOUNTER — Other Ambulatory Visit: Payer: Self-pay

## 2021-03-18 ENCOUNTER — Ambulatory Visit: Payer: No Typology Code available for payment source

## 2021-03-18 DIAGNOSIS — N6452 Nipple discharge: Secondary | ICD-10-CM

## 2021-03-19 ENCOUNTER — Encounter: Payer: No Typology Code available for payment source | Admitting: Family Medicine

## 2021-03-22 ENCOUNTER — Encounter: Payer: Self-pay | Admitting: Internal Medicine

## 2021-03-23 ENCOUNTER — Other Ambulatory Visit: Payer: Self-pay

## 2021-03-23 ENCOUNTER — Ambulatory Visit (AMBULATORY_SURGERY_CENTER): Payer: No Typology Code available for payment source | Admitting: Internal Medicine

## 2021-03-23 ENCOUNTER — Encounter: Payer: Self-pay | Admitting: Internal Medicine

## 2021-03-23 VITALS — BP 138/85 | HR 60 | Temp 97.1°F | Resp 17 | Ht 65.0 in | Wt 235.7 lb

## 2021-03-23 DIAGNOSIS — K635 Polyp of colon: Secondary | ICD-10-CM | POA: Diagnosis not present

## 2021-03-23 DIAGNOSIS — Z800 Family history of malignant neoplasm of digestive organs: Secondary | ICD-10-CM | POA: Diagnosis present

## 2021-03-23 DIAGNOSIS — Z86010 Personal history of colonic polyps: Secondary | ICD-10-CM

## 2021-03-23 DIAGNOSIS — D123 Benign neoplasm of transverse colon: Secondary | ICD-10-CM

## 2021-03-23 MED ORDER — SODIUM CHLORIDE 0.9 % IV SOLN: 500.0000 mL | Freq: Once | INTRAVENOUS | Status: DC

## 2021-03-23 NOTE — Progress Notes (Signed)
Called to room to assist during endoscopic procedure.  Patient ID and intended procedure confirmed with present staff. Received instructions for my participation in the procedure from the performing physician.  

## 2021-03-23 NOTE — Progress Notes (Signed)
[facility] History and Physical   Primary Care Physician:  Billie Ruddy, MD   Reason for Procedure:   Hx colon polyp and family hx colon cancer  Plan:    colonoscopy     HPI: Monica Spencer is a 50 y.o. female  W/ hx 2 mm adenoma 2017, brother and parents had CRCA Genetic testing negative  Past Medical History:  Diagnosis Date   Anemia 2004   uterine bleeding needed transfusion   Blood transfusion without reported diagnosis 2004   uterine fibroid bleeding   Hx of adenomatous polyp of colon 01/28/2016   Hypertension    Thyroid disease     Past Surgical History:  Procedure Laterality Date   BREAST LUMPECTOMY WITH RADIOACTIVE SEED LOCALIZATION Right 05/01/2019   Procedure: RIGHT BREAST LUMPECTOMY WITH RADIOACTIVE SEED LOCALIZATION X 2;  Surgeon: Jovita Kussmaul, MD;  Location: [place];  Service: General;  Laterality: Right;   BREAST REDUCTION SURGERY Bilateral 05/01/2019   Procedure: BILATERAL MAMMARY REDUCTION  (BREAST);  Surgeon: Wallace Going, DO;  Location: [place];  Service: Plastics;  Laterality: Bilateral;   ENDOMETRIAL ABLATION W/ NOVASURE     KNEE ARTHROSCOPY Left    MYOMECTOMY     THYROIDECTOMY      Prior to Admission medications   Medication Sig Start Date End Date Taking? Authorizing Provider  atenolol (TENORMIN) 25 MG tablet TAKE 1 TABLET DAILY 01/20/21  Yes Billie Ruddy, MD  Cholecalciferol 125 MCG (5000 UT) capsule Take 5,000 Units by mouth daily.   Yes [provider]  levothyroxine (SYNTHROID) 100 MCG tablet Take 1 tablet (100 mcg total) by mouth daily before breakfast. 03/16/20  Yes Billie Ruddy, MD  OVER THE COUNTER MEDICATION Centrum -Take 1 tablet by mouth daily.   Yes [provider]    Current Outpatient Medications  Medication Sig Dispense Refill   atenolol (TENORMIN) 25 MG tablet TAKE 1 TABLET DAILY 90 tablet 1   Cholecalciferol 125 MCG (5000 UT)  capsule Take 5,000 Units by mouth daily.     levothyroxine (SYNTHROID) 100 MCG tablet Take 1 tablet (100 mcg total) by mouth daily before breakfast. 90 tablet 3   OVER THE COUNTER MEDICATION Centrum -Take 1 tablet by mouth daily.     Current Facility-Administered Medications  Medication Dose Route Frequency Provider Last Rate Last Admin   0.9 %  sodium chloride infusion  500 mL Intravenous Once Gatha Mayer, MD        Allergies as of 03/23/2021   (No Known Allergies)    Family History  Problem Relation Age of Onset   Colon polyps Mother    Colon cancer Mother    Colon polyps Father    Colon cancer Father    Colon polyps Sister    Colon polyps Brother    Colon cancer Brother    Breast cancer Maternal Grandmother 9   Esophageal cancer Neg Hx    Rectal cancer Neg Hx    Stomach cancer Neg Hx     Social History   Socioeconomic History   Marital status: Married    Spouse name: Not on file   Number of children: Not on file   Years of education: Not on file   Highest education level: Not on file  Occupational History   Not on file  Tobacco Use   Smoking status: Never   Smokeless tobacco: Never  Vaping Use   Vaping Use: Never used  Substance  and Sexual Activity   Alcohol use: Not Currently    Alcohol/week: 1.0 standard drink    Types: 1 Glasses of wine per week    Comment: once a year or so   Drug use: Never   Sexual activity: Not on file  Other Topics Concern   Not on file  Social History Narrative   Not on file   Social Determinants of Health   Financial Resource Strain: Not on file  Food Insecurity: Not on file  Transportation Needs: Not on file  Physical Activity: Not on file  Stress: Not on file  Social Connections: Not on file  Intimate Partner Violence: Not on file    Review of Systems: All other review of systems negative except as mentioned in the HPI.  Physical Exam: Vital signs in last 24 hours: BP 117/69   Pulse 64   Temp (!) 97.1 F  (36.2 C) (Skin)   Ht '5\' 5"'$  (1.651 m)   Wt 235 lb 11.2 oz (106.9 kg)   LMP 11/19/2019 (Exact Date)   SpO2 98%   BMI 39.22 kg/m     General:   Alert,  Well-developed, well-nourished, pleasant and cooperative in NAD Lungs:  Clear throughout to auscultation.   Heart:  Regular rate and rhythm; no murmurs, clicks, rubs,  or gallops. Abdomen:  Soft, nontender and nondistended. Normal bowel sounds.   Neuro/Psych:  Alert and cooperative. Normal mood and affect. A and O x 3   '@Keeva Reisen'$  Simonne Maffucci, MD, [facility] [phone] (pager) 03/23/2021 3:43 PM@

## 2021-03-23 NOTE — Op Note (Signed)
[facility] Patient Name: Monica Spencer Procedure Date: 03/23/2021 3:28 PM MRN: [id number] Endoscopist: Gatha Mayer , MD Age: 50 Referring MD:  Date of Birth: Jan 12, 1971 Gender: Female Account #: [id number] Procedure:                Colonoscopy Indications:              Surveillance: Personal history of adenomatous                            polyps on last colonoscopy 5 years ago [facility]                            HISTORY OF COLON CANCER Medicines:                Propofol per Anesthesia, Monitored Anesthesia Care Procedure:                Pre-Anesthesia Assessment:                           - Prior to the procedure, a History and Physical                            was performed, and patient medications and                            allergies were reviewed. The patient's tolerance of                            previous anesthesia was also reviewed. The risks                            and benefits of the procedure and the sedation                            options and risks were discussed with the patient.                            All questions were answered, and informed consent                            was obtained. Prior Anticoagulants: The patient has                            taken no previous anticoagulant or antiplatelet                            agents. ASA Grade Assessment: II - A patient with                            mild systemic disease. After reviewing the risks                            and benefits, the patient was deemed in  satisfactory condition to undergo the procedure.                           After obtaining informed consent, the colonoscope                            was passed under direct vision. Throughout the                            procedure, the patient's blood pressure, pulse, and                            oxygen saturations were monitored continuously. The                            Olympus  [id number]-[id number]) Colonoscope was                            introduced through the anus and advanced to the the                            cecum, identified by appendiceal orifice and                            ileocecal valve. The colonoscopy was performed                            without difficulty. The patient tolerated the                            procedure well. The quality of the bowel                            preparation was good. The ileocecal valve,                            appendiceal orifice, and rectum were photographed. Scope In: 3:46:14 PM Scope Out: 3:58:25 PM Scope Withdrawal Time: 0 hours 10 minutes 3 seconds  Total Procedure Duration: 0 hours 12 minutes 11 seconds  Findings:                 The perianal and digital rectal examinations were                            normal.                           Two sessile polyps were found in the transverse                            colon. The polyps were diminutive in size. These                            polyps were removed with a cold biopsy forceps.  Resection and retrieval were complete. Verification                            of patient identification for the specimen was                            done. Estimated blood loss was minimal.                           The exam was otherwise without abnormality on                            direct and retroflexion views. Complications:            No immediate complications. Estimated Blood Loss:     Estimated blood loss was minimal. Impression:               - Two diminutive polyps in the transverse colon,                            removed with a cold biopsy forceps. Resected and                            retrieved.                           - The examination was otherwise normal on direct                            and retroflexion views.                           - Personal history of colonic polyp 2 mm adenoma                             2017, and parents + brother had CRCA - patient                            genetic testing negative Recommendation:           - Patient has a contact number available for                            emergencies. The signs and symptoms of potential                            delayed complications were discussed with the                            patient. Return to normal activities tomorrow.                            Written discharge instructions were provided to the                            patient.                           -  Resume previous diet.                           - Continue present medications.                           - Repeat colonoscopy is recommended. The                            colonoscopy date will be determined after pathology                            results from today's exam become available for                            review. Gatha Mayer, MD 03/23/2021 4:09:59 PM This report has been signed electronically.

## 2021-03-23 NOTE — Patient Instructions (Addendum)
I found and removed 2 tiny polyps.  I will let you know pathology results and when to have another routine colonoscopy by mail and/or My Chart.  I am thinking 5 years.  I appreciate the opportunity to care for you. Gatha Mayer, MD, [facility]  Handout on polyps given.  YOU HAD AN ENDOSCOPIC PROCEDURE TODAY AT [facility]:   Refer to the procedure report that was given to you for any specific questions about what was found during the examination.  If the procedure report does not answer your questions, please call your gastroenterologist to clarify.  If you requested that your care partner not be given the details of your procedure findings, then the procedure report has been included in a sealed envelope for you to review at your convenience later.  YOU SHOULD EXPECT: Some feelings of bloating in the abdomen. Passage of more gas than usual.  Walking can help get rid of the air that was put into your GI tract during the procedure and reduce the bloating. If you had a lower endoscopy (such as a colonoscopy or flexible sigmoidoscopy) you may notice spotting of blood in your stool or on the toilet paper. If you underwent a bowel prep for your procedure, you may not have a normal bowel movement for a few days.  Please Note:  You might notice some irritation and congestion in your nose or some drainage.  This is from the oxygen used during your procedure.  There is no need for concern and it should clear up in a day or so.  SYMPTOMS TO REPORT IMMEDIATELY:  Following lower endoscopy (colonoscopy or flexible sigmoidoscopy):  Excessive amounts of blood in the stool  Significant tenderness or worsening of abdominal pains  Swelling of the abdomen that is new, acute  Fever of 100F or higher   For urgent or emergent issues, a gastroenterologist can be reached at any hour by calling [phone]. Do not use MyChart messaging for urgent concerns.    DIET:  We do recommend a small  meal at first, but then you may proceed to your regular diet.  Drink plenty of fluids but you should avoid alcoholic beverages for 24 hours.  ACTIVITY:  You should plan to take it easy for the rest of today and you should NOT DRIVE or use heavy machinery until tomorrow (because of the sedation medicines used during the test).    FOLLOW UP: Our staff will call the number listed on your records 48-72 hours following your procedure to check on you and address any questions or concerns that you may have regarding the information given to you following your procedure. If we do not reach you, we will leave a message.  We will attempt to reach you two times.  During this call, we will ask if you have developed any symptoms of COVID 19. If you develop any symptoms (ie: fever, flu-like symptoms, shortness of breath, cough etc.) before then, please call [phone].  If you test positive for Covid 19 in the 2 weeks post procedure, please call and report this information to [place].    If any biopsies were taken you will be contacted by phone or by letter within the next 1-3 weeks.  Please call us at [phone] if you have not heard about the biopsies in 3 weeks.    SIGNATURES/CONFIDENTIALITY: You and/or your care partner have signed paperwork which will be entered into your electronic medical record.  These signatures attest to  the fact that that the information above on your After Visit Summary has been reviewed and is understood.  Full responsibility of the confidentiality of this discharge information lies with you and/or your care-partner.

## 2021-03-23 NOTE — Progress Notes (Signed)
Medical history reviewed with no changes noted. VS assessed by A.S 

## 2021-03-23 NOTE — Progress Notes (Signed)
Vss nad transfer to pacu rn

## 2021-03-25 ENCOUNTER — Telehealth: Payer: Self-pay

## 2021-03-25 ENCOUNTER — Other Ambulatory Visit: Payer: Self-pay

## 2021-03-25 NOTE — Telephone Encounter (Signed)
  Follow up Call-  Call back number 03/23/2021  Post procedure Call Back phone  # [phone]  Permission to leave phone message Yes  Some recent data might be hidden     Patient questions:  Do you have a fever, pain , or abdominal swelling? No. Pain Score  0 *  Have you tolerated food without any problems? Yes.    Have you been able to return to your normal activities? Yes.    Do you have any questions about your discharge instructions: Diet   No. Medications  No. Follow up visit  No.  Do you have questions or concerns about your Care? No.  Actions: * If pain score is 4 or above: No action needed, pain <4.  Have you developed a fever since your procedure? no  2.   Have you had an respiratory symptoms (SOB or cough) since your procedure? no  3.   Have you tested positive for COVID 19 since your procedure no  4.   Have you had any family members/close contacts diagnosed with the COVID 19 since your procedure?  no   If yes to any of these questions please route to Joylene John, RN and Joella Prince, RN

## 2021-03-26 ENCOUNTER — Ambulatory Visit (INDEPENDENT_AMBULATORY_CARE_PROVIDER_SITE_OTHER): Payer: No Typology Code available for payment source | Admitting: Family Medicine

## 2021-03-26 ENCOUNTER — Encounter: Payer: Self-pay | Admitting: Family Medicine

## 2021-03-26 VITALS — BP 130/82 | HR 63 | Temp 98.1°F | Ht 65.0 in | Wt 234.0 lb

## 2021-03-26 DIAGNOSIS — Z1159 Encounter for screening for other viral diseases: Secondary | ICD-10-CM | POA: Diagnosis not present

## 2021-03-26 DIAGNOSIS — Z0000 Encounter for general adult medical examination without abnormal findings: Secondary | ICD-10-CM

## 2021-03-26 DIAGNOSIS — Z13220 Encounter for screening for lipoid disorders: Secondary | ICD-10-CM | POA: Diagnosis not present

## 2021-03-26 DIAGNOSIS — E890 Postprocedural hypothyroidism: Secondary | ICD-10-CM | POA: Diagnosis not present

## 2021-03-26 DIAGNOSIS — I10 Essential (primary) hypertension: Secondary | ICD-10-CM

## 2021-03-26 DIAGNOSIS — Z780 Asymptomatic menopausal state: Secondary | ICD-10-CM

## 2021-03-26 LAB — LIPID PANEL
Cholesterol: 238 mg/dL — ABNORMAL HIGH (ref 0–200)
HDL: 48 mg/dL (ref >39.00)
LDL Cholesterol: 167 mg/dL — ABNORMAL HIGH (ref 0–99)
NonHDL: 190.02
Total CHOL/HDL Ratio: 5
Triglycerides: 116 mg/dL (ref 0.0–149.0)
VLDL: 23.2 mg/dL (ref 0.0–40.0)

## 2021-03-26 LAB — TSH: TSH: 0.71 u[IU]/mL (ref 0.35–5.50)

## 2021-03-26 LAB — CBC WITH DIFFERENTIAL/PLATELET
Basophils Absolute: 0 10*3/uL (ref 0.0–0.1)
Basophils Relative: 1.1 % (ref 0.0–3.0)
Eosinophils Absolute: 0 10*3/uL (ref 0.0–0.7)
Eosinophils Relative: 1.4 % (ref 0.0–5.0)
HCT: 37.1 % (ref 36.0–46.0)
Hemoglobin: 12.3 g/dL (ref 12.0–15.0)
Lymphocytes Relative: 49.2 % — ABNORMAL HIGH (ref 12.0–46.0)
Lymphs Abs: 1.7 10*3/uL (ref 0.7–4.0)
MCHC: 33.3 g/dL (ref 30.0–36.0)
MCV: 87.4 fl (ref 78.0–100.0)
Monocytes Absolute: 0.2 10*3/uL (ref 0.1–1.0)
Monocytes Relative: 6.6 % (ref 3.0–12.0)
Neutro Abs: 1.4 10*3/uL (ref 1.4–7.7)
Neutrophils Relative %: 41.7 % — ABNORMAL LOW (ref 43.0–77.0)
Platelets: 302 10*3/uL (ref 150.0–400.0)
RBC: 4.24 Mil/uL (ref 3.87–5.11)
RDW: 13.5 % (ref 11.5–15.5)
WBC: 3.4 10*3/uL — ABNORMAL LOW (ref 4.0–10.5)

## 2021-03-26 LAB — COMPREHENSIVE METABOLIC PANEL
ALT: 13 U/L (ref 0–35)
AST: 18 U/L (ref 0–37)
Albumin: 4.4 g/dL (ref 3.5–5.2)
Alkaline Phosphatase: 62 U/L (ref 39–117)
BUN: 16 mg/dL (ref 6–23)
CO2: 26 mEq/L (ref 19–32)
Calcium: 9.5 mg/dL (ref 8.4–10.5)
Chloride: 104 mEq/L (ref 96–112)
Creatinine, Ser: 0.96 mg/dL (ref 0.40–1.20)
GFR: 68.01 mL/min (ref >60.00)
Glucose, Bld: 80 mg/dL (ref 70–99)
Potassium: 4 mEq/L (ref 3.5–5.1)
Sodium: 139 mEq/L (ref 135–145)
Total Bilirubin: 0.5 mg/dL (ref 0.2–1.2)
Total Protein: 7.6 g/dL (ref 6.0–8.3)

## 2021-03-26 LAB — HEMOGLOBIN A1C: Hgb A1c MFr Bld: 6 % (ref 4.6–6.5)

## 2021-03-26 NOTE — Progress Notes (Signed)
Subjective:     Monica Spencer is a 50 y.o. female and is here for a comprehensive physical exam. The patient reports no problems.  Patient states she is doing well overall.  Stable on Synthroid 100 mcg.  BP controlled on atenolol 25 mg.  Denies headaches, changes in vision, CP.  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 for a week vacation with her family as her daughter had a dance competition.  Mammogram done 03/18/2021.  Patient states she no longer has to get diagnostic mammograms 2/2 history of fibroid dense breast tissue.  Patient also had colonoscopy done 03/23/2021 by Dr. Carlean Purl.  Awaiting pathology report for several polyps that were removed to determine if she needs 3 or 5-year recall.  Patient states she thinks she is in menopause as LMP was 11/2019.  Patient denies menses or spotting since that time.  Had a hot flash yesterday, but it was not that bad.  Denies changes in sleep, mood, or weight.  Social History   Socioeconomic History   Marital status: Married    Spouse name: Not on file   Number of children: Not on file   Years of education: Not on file   Highest education level: Not on file  Occupational History   Not on file  Tobacco Use   Smoking status: Never   Smokeless tobacco: Never  Vaping Use   Vaping Use: Never used  Substance and Sexual Activity   Alcohol use: Not Currently    Alcohol/week: 1.0 standard drink    Types: 1 Glasses of wine per week    Comment: once a year or so   Drug use: Never   Sexual activity: Not on file  Other Topics Concern   Not on file  Social History Narrative   Not on file   Social Determinants of Health   Financial Resource Strain: Not on file  Food Insecurity: Not on file  Transportation Needs: Not on file  Physical Activity: Not on file  Stress: Not on file  Social Connections: Not on file  Intimate Partner Violence: Not on file   Health Maintenance  Topic Date Due    Pneumococcal Vaccine 61-62 Years old (1 - PCV) Never done   HIV Screening  Never done   Hepatitis C Screening  Never done   TETANUS/TDAP  Never done   Zoster Vaccines- Shingrix (1 of 2) Never done   COVID-19 Vaccine (4 - Booster for [place] series) 09/23/2020   INFLUENZA VACCINE  03/15/2021   PAP SMEAR-Modifier  02/26/2022   MAMMOGRAM  03/19/2023   COLONOSCOPY (Pts 45-31yr Insurance coverage will need to be confirmed)  03/24/2031   HPV VACCINES  Aged Out    The following portions of the patient's history were reviewed and updated as appropriate: allergies, current medications, past family history, past medical history, past social history, past surgical history, and problem list.  Review of Systems Pertinent items noted in HPI and remainder of comprehensive ROS otherwise negative.   Objective:    BP 130/82 (BP Location: Left Arm, Patient Position: Sitting, Cuff Size: Normal)   Pulse 63   Temp 98.1 F (36.7 C) (Oral)   Ht '5\' 5"'$  (1.651 m)   Wt 234 lb (106.1 kg)   LMP 11/19/2019 (Exact Date)   SpO2 98%   BMI 38.94 kg/m  General appearance: alert, cooperative, and no distress Head: Normocephalic, without obvious abnormality, atraumatic Eyes: conjunctivae/corneas clear. PERRL, EOM's  intact. Fundi benign. Ears: normal TM's and external ear canals both ears Nose: Nares normal. Septum midline. Mucosa normal. No drainage or sinus tenderness. Throat: lips, mucosa, and tongue normal; teeth and gums normal Neck: no adenopathy, no carotid bruit, no JVD, supple, symmetrical, trachea midline, and thyroid surgically absent Lungs: clear to auscultation bilaterally Heart: regular rate and rhythm, S1, S2 normal, no murmur, click, rub or gallop Abdomen: soft, non-tender; bowel sounds normal; no masses,  no organomegaly Extremities: extremities normal, atraumatic, no cyanosis or edema Pulses: 2+ and symmetric Skin: Skin color, texture, turgor normal. No rashes or lesions.  Well healed surgical  incision lower anterior neck Lymph nodes: Cervical, supraclavicular, and axillary nodes normal. Neurologic: Alert and oriented X 3, normal strength and tone. Normal symmetric reflexes. Normal coordination and gait    Assessment:    Healthy female exam.      Plan:    Anticipatory guidance given including wearing seatbelts, smoke detectors in the home, increasing physical activity, increasing p.o. intake of water and vegetables. -PHQ-9 score 0 -GAD-7 score 0 -labs -Mammogram up-to-date done 03/18/2021 -Colonoscopy up-to-date done 03/23/2021.  Awaiting pathology to determine recall in 3 or 5 years based on polyp present.  Continue follow-up with GI, Dr. Carlean Purl -Reviewed immunizations.  Consider influenza vaccine when available later this month -Given handout -Next CPE in 1 year See After Visit Summary for Counseling Recommendations   Postoperative hypothyroidism  -Continue Synthroid 100 mcg daily -We will adjust dose if needed based on lab results - Plan: TSH  Screening for cholesterol level -Total cholesterol 207, LDL 140, HDL 45, triglycerides 1 3 on 03/12/2020 -Continue lifestyle modifications - Plan: Lipid panel  Encounter for hepatitis C screening test for low risk patient  - Plan: Hep C Antibody  Essential hypertension -Controlled -Continue atenolol 25 mg  Menopause present -LMP April 2021 -Discussed symptomatic treatment available if needed -Continue to monitor  F/u prn in the next 4-6 months, sooner if needed  Grier Mitts, MD

## 2021-03-29 ENCOUNTER — Other Ambulatory Visit: Payer: Self-pay | Admitting: Family Medicine

## 2021-03-29 DIAGNOSIS — E890 Postprocedural hypothyroidism: Secondary | ICD-10-CM

## 2021-04-06 ENCOUNTER — Encounter: Payer: Self-pay | Admitting: Internal Medicine

## 2021-04-19 ENCOUNTER — Ambulatory Visit
Admission: RE | Admit: 2021-04-19 | Discharge: 2021-04-19 | Disposition: A | Discharge status: Home / Self Care | Payer: No Typology Code available for payment source | Source: Ambulatory Visit | Attending: Physician Assistant | Admitting: Physician Assistant

## 2021-04-19 ENCOUNTER — Other Ambulatory Visit: Payer: Self-pay

## 2021-04-19 VITALS — BP 141/85 | HR 78 | Temp 97.6°F | Resp 20

## 2021-04-19 DIAGNOSIS — M545 Low back pain, unspecified: Secondary | ICD-10-CM

## 2021-04-19 MED ORDER — DICLOFENAC SODIUM 75 MG PO TBEC: 75.0000 mg | DELAYED_RELEASE_TABLET | Freq: Two times a day (BID) | ORAL | 0 refills | Status: DC

## 2021-04-19 MED ORDER — TIZANIDINE HCL 4 MG PO CAPS: 4.0000 mg | ORAL_CAPSULE | Freq: Three times a day (TID) | ORAL | 0 refills | Status: DC

## 2021-04-19 NOTE — Discharge Instructions (Addendum)
Take diclofenac twice a day.  This is an NSAID so he should not take additional NSAIDs including aspirin, ibuprofen/Advil, naproxen/Aleve with this medication as it can cause stomach bleeding.  You can use Tylenol for breakthrough pain.  You can use Zanaflex (tizanidine) up to 3 times a day but this will make you sleepy do not drive or drink alcohol while taking it.  I recommend that you use heat, rest, stretch for additional symptom relief.  If your symptoms are not improving with this medication and recommend you follow-up with your primary care provider as you may need imaging and/or a referral to physical therapy.  If you have any worsening symptoms including leg weakness, difficulty walking, numbness in your legs, bowel/bladder accidents or difficulty going to the bathroom you need to be seen immediately.

## 2021-04-19 NOTE — ED Triage Notes (Signed)
Pt c/o left lower back pain, that has no been resolved with medication (motrin, or tylenol). Pt states she does not know where pain came from.  Started: 7 days ago

## 2021-04-19 NOTE — ED Provider Notes (Signed)
[facility]    CSN: [id number] Arrival date & time: 04/19/21  [id number]      History   Chief Complaint Chief Complaint  Patient presents with   Back Pain    HPI Monica Spencer is a 49 y.o. female.   Patient presents today with a weeklong history of left-sided lower back pain.  She denies any known injury or increase in activity prior to symptom onset.  She does report previously injuring her back several years ago when she was moving in the same area but has not had recurrent symptoms until this past week.  She reports pain is rated 7 on a 0-10 pain scale, localized to left lower back without radiation, described as aching with periodic squeezing, worse with prolonged standing or certain movements, no alleviating factors identified.  She has tried Tylenol and ibuprofen without improvement of symptoms.  She denies any previous spinal surgery.  Denies history of malignancy.  She denies any bowel/bladder incontinence, lower extremity weakness, saddle anesthesia.   Past Medical History:  Diagnosis Date   Anemia 2004   uterine bleeding needed transfusion   Blood transfusion without reported diagnosis 2004   uterine fibroid bleeding   Hx of adenomatous polyp of colon 01/28/2016   Hypertension    Thyroid disease     Patient Active Problem List   Diagnosis Date Noted   Essential hypertension 03/26/2021   Menopause present 03/26/2021   S/P bilateral breast reduction 09/06/2019   Neck pain 03/19/2019   Back pain 03/19/2019   Symptomatic mammary hypertrophy 03/19/2019   Postoperative hypothyroidism 03/06/2019   Ductal papilloma, R breast 03/06/2019   Mixed hyperlipidemia 03/06/2019   History of prediabetes 03/06/2019   Hx of adenomatous polyp of colon 01/28/2016    Past Surgical History:  Procedure Laterality Date   BREAST LUMPECTOMY WITH RADIOACTIVE SEED LOCALIZATION Right 05/01/2019   Procedure: RIGHT BREAST LUMPECTOMY WITH RADIOACTIVE SEED LOCALIZATION X 2;   Surgeon: Jovita Kussmaul, MD;  Location: [facility];  Service: General;  Laterality: Right;   BREAST REDUCTION SURGERY Bilateral 05/01/2019   Procedure: BILATERAL MAMMARY REDUCTION  (BREAST);  Surgeon: Wallace Going, DO;  Location: [facility];  Service: Plastics;  Laterality: Bilateral;   ENDOMETRIAL ABLATION W/ NOVASURE     KNEE ARTHROSCOPY Left    MYOMECTOMY     THYROIDECTOMY      OB History   No obstetric history on file.      Home Medications    Prior to Admission medications   Medication Sig Start Date End Date Taking? Authorizing Provider  diclofenac (VOLTAREN) 75 MG EC tablet Take 1 tablet (75 mg total) by mouth 2 (two) times daily. 04/19/21  Yes Ameris Akamine K, PA-C  tiZANidine (ZANAFLEX) 4 MG capsule Take 1 capsule (4 mg total) by mouth 3 (three) times daily. 04/19/21  Yes Mervin Ramires K, PA-C  atenolol (TENORMIN) 25 MG tablet TAKE 1 TABLET DAILY 01/20/21   Billie Ruddy, MD  Cholecalciferol 125 MCG (5000 UT) capsule Take 5,000 Units by mouth daily. Patient not taking: Reported on 03/26/2021    [provider]  OVER THE COUNTER MEDICATION Centrum -Take 1 tablet by mouth daily.    [provider]  SYNTHROID 100 MCG tablet TAKE 1 TABLET DAILY BEFORE BREAKFAST 03/29/21   Billie Ruddy, MD    Family History Family History  Problem Relation Age of Onset   Colon polyps Mother    Colon cancer Mother  Colon polyps Father    Colon cancer Father    Colon polyps Sister    Colon polyps Brother    Colon cancer Brother    Breast cancer Maternal Grandmother 35   Esophageal cancer Neg Hx    Rectal cancer Neg Hx    Stomach cancer Neg Hx     Social History Social History   Tobacco Use   Smoking status: Never   Smokeless tobacco: Never  Vaping Use   Vaping Use: Never used  Substance Use Topics   Alcohol use: Not Currently    Alcohol/week: 1.0 standard drink    Types: 1 Glasses of wine per week    Comment: once a year  or so   Drug use: Never     Allergies   Patient has no known allergies.   Review of Systems Review of Systems  Constitutional:  Positive for activity change. Negative for appetite change, fatigue and fever.  Respiratory:  Negative for cough and shortness of breath.   Cardiovascular:  Negative for chest pain.  Gastrointestinal:  Negative for abdominal pain, diarrhea, nausea and vomiting.  Musculoskeletal:  Positive for back pain. Negative for arthralgias and myalgias.  Neurological:  Negative for dizziness, weakness, light-headedness, numbness and headaches.    Physical Exam Triage Vital Signs ED Triage Vitals  Enc Vitals Group     BP 04/19/21 0835 (!) 141/85     Pulse Rate 04/19/21 0835 78     Resp 04/19/21 0835 20     Temp 04/19/21 0835 97.6 F (36.4 C)     Temp Source 04/19/21 0835 Axillary     SpO2 04/19/21 0835 98 %     Weight --      Height --      Head Circumference --      Peak Flow --      Pain Score 04/19/21 0833 7     Pain Loc --      Pain Edu? --      Excl. in [place]? --    No data found.  Updated Vital Signs BP (!) 141/85 (BP Location: Left Arm)   Pulse 78   Temp 97.6 F (36.4 C) (Axillary)   Resp 20   LMP 11/19/2019 (Exact Date)   SpO2 98%   Visual Acuity Right Eye Distance:   Left Eye Distance:   Bilateral Distance:    Right Eye Near:   Left Eye Near:    Bilateral Near:     Physical Exam Vitals reviewed.  Constitutional:      General: She is awake. She is not in acute distress.    Appearance: Normal appearance. She is normal weight. She is not ill-appearing.     Comments: Very pleasant female appears stated age no acute distress sitting comfortably on exam room table  HENT:     Head: Normocephalic and atraumatic.  Cardiovascular:     Rate and Rhythm: Normal rate and regular rhythm.     Heart sounds: Normal heart sounds, S1 normal and S2 normal. No murmur heard. Pulmonary:     Effort: Pulmonary effort is normal.     Breath sounds:  Normal breath sounds. No wheezing, rhonchi or rales.     Comments: Clear to auscultation bilaterally Abdominal:     Palpations: Abdomen is soft.     Tenderness: There is no abdominal tenderness.  Musculoskeletal:     Cervical back: No tenderness or bony tenderness.     Thoracic back: No tenderness or bony tenderness.  Lumbar back: Tenderness present. No bony tenderness. Positive left straight leg raise test. Negative right straight leg raise test.     Comments: Back: No pain percussion of vertebrae.  Mild tenderness palpation of left lumbar paraspinal muscles.  Positive straight leg raise on left.  Negative Faber bilaterally.  Psychiatric:        Behavior: Behavior is cooperative.      UC Treatments / Results  Labs (all labs ordered are listed, but only abnormal results are displayed) Labs Reviewed - No data to display  EKG   Radiology No results found.  Procedures Procedures (including critical care time)  Medications Ordered in UC Medications - No data to display  Initial Impression / Assessment and Plan / UC Course  I have reviewed the triage vital signs and the nursing notes.  Pertinent labs & imaging results that were available during my care of the patient were reviewed by me and considered in my medical decision making (see chart for details).     No indication for imaging given lack of alarm symptoms and no bony tenderness on exam.  Patient was started on diclofenac twice daily with instruction not to take additional NSAIDs including aspirin, ibuprofen/Advil, naproxen/Aleve due to risk of GI bleeding.  She was prescribed Zanaflex to be used up to 3 times a day with instruction that this can make her sleepy so she should not drive or drink alcohol while taking this medication.  Encouraged her to do conservative treatment measures including rest, heat, stretch for symptom relief.  Discussed that if symptoms or not improving she should follow-up with her primary care  provider to consider physical therapy referral and/or imaging.  Discussed alarm symptoms that warrant emergent evaluation.  Strict return precautions given to which patient expressed understanding.  Final Clinical Impressions(s) / UC Diagnoses   Final diagnoses:  Acute left-sided low back pain without sciatica     Discharge Instructions      Take diclofenac twice a day.  This is an NSAID so he should not take additional NSAIDs including aspirin, ibuprofen/Advil, naproxen/Aleve with this medication as it can cause stomach bleeding.  You can use Tylenol for breakthrough pain.  You can use Zanaflex (tizanidine) up to 3 times a day but this will make you sleepy do not drive or drink alcohol while taking it.  I recommend that you use heat, rest, stretch for additional symptom relief.  If your symptoms are not improving with this medication and recommend you follow-up with your primary care provider as you may need imaging and/or a referral to physical therapy.  If you have any worsening symptoms including leg weakness, difficulty walking, numbness in your legs, bowel/bladder accidents or difficulty going to the bathroom you need to be seen immediately.     ED Prescriptions     Medication Sig Dispense Auth. Provider   diclofenac (VOLTAREN) 75 MG EC tablet Take 1 tablet (75 mg total) by mouth 2 (two) times daily. 14 tablet Khushbu Pippen K, PA-C   tiZANidine (ZANAFLEX) 4 MG capsule Take 1 capsule (4 mg total) by mouth 3 (three) times daily. 21 capsule Coen Miyasato K, PA-C      PDMP not reviewed this encounter.   Terrilee Croak, PA-C 04/19/21 [phone]

## 2021-07-07 ENCOUNTER — Other Ambulatory Visit: Payer: Self-pay

## 2021-07-07 DIAGNOSIS — I10 Essential (primary) hypertension: Secondary | ICD-10-CM

## 2021-07-07 MED ORDER — ATENOLOL 25 MG PO TABS: 25.0000 mg | ORAL_TABLET | Freq: Every day | ORAL | 1 refills | Status: DC

## 2022-04-04 ENCOUNTER — Other Ambulatory Visit: Payer: Self-pay | Admitting: Family Medicine

## 2022-04-04 DIAGNOSIS — E890 Postprocedural hypothyroidism: Secondary | ICD-10-CM

## 2022-06-24 ENCOUNTER — Other Ambulatory Visit: Payer: Self-pay | Admitting: Family Medicine

## 2022-06-24 DIAGNOSIS — E890 Postprocedural hypothyroidism: Secondary | ICD-10-CM

## 2022-07-06 ENCOUNTER — Telehealth: Payer: Self-pay | Admitting: Family Medicine

## 2022-07-06 NOTE — Telephone Encounter (Signed)
Patient requesting refill atenolol (TENORMIN) 25 MG tablet  [facility] [place] [place] [facility] Phone: [phone]  Fax: [phone]    Patient has 2 pills left and scheduled for OV 07/11/22

## 2022-07-11 ENCOUNTER — Encounter: Payer: Self-pay | Admitting: Family Medicine

## 2022-07-11 ENCOUNTER — Ambulatory Visit: Payer: No Typology Code available for payment source | Admitting: Family Medicine

## 2022-07-11 VITALS — BP 142/90 | HR 83 | Temp 98.1°F | Wt 245.6 lb

## 2022-07-11 DIAGNOSIS — E782 Mixed hyperlipidemia: Secondary | ICD-10-CM

## 2022-07-11 DIAGNOSIS — N6452 Nipple discharge: Secondary | ICD-10-CM

## 2022-07-11 DIAGNOSIS — I10 Essential (primary) hypertension: Secondary | ICD-10-CM

## 2022-07-11 DIAGNOSIS — Z1159 Encounter for screening for other viral diseases: Secondary | ICD-10-CM

## 2022-07-11 DIAGNOSIS — Z0000 Encounter for general adult medical examination without abnormal findings: Secondary | ICD-10-CM

## 2022-07-11 DIAGNOSIS — E890 Postprocedural hypothyroidism: Secondary | ICD-10-CM | POA: Diagnosis not present

## 2022-07-11 LAB — CBC WITH DIFFERENTIAL/PLATELET
Basophils Absolute: 0 10*3/uL (ref 0.0–0.1)
Basophils Relative: 0.6 % (ref 0.0–3.0)
Eosinophils Absolute: 0.1 10*3/uL (ref 0.0–0.7)
Eosinophils Relative: 1.5 % (ref 0.0–5.0)
HCT: 38.4 % (ref 36.0–46.0)
Hemoglobin: 12.7 g/dL (ref 12.0–15.0)
Lymphocytes Relative: 45.7 % (ref 12.0–46.0)
Lymphs Abs: 1.6 10*3/uL (ref 0.7–4.0)
MCHC: 33.2 g/dL (ref 30.0–36.0)
MCV: 86.4 fl (ref 78.0–100.0)
Monocytes Absolute: 0.2 10*3/uL (ref 0.1–1.0)
Monocytes Relative: 5.1 % (ref 3.0–12.0)
Neutro Abs: 1.7 10*3/uL (ref 1.4–7.7)
Neutrophils Relative %: 47.1 % (ref 43.0–77.0)
Platelets: 347 10*3/uL (ref 150.0–400.0)
RBC: 4.44 Mil/uL (ref 3.87–5.11)
RDW: 14 % (ref 11.5–15.5)
WBC: 3.5 10*3/uL — ABNORMAL LOW (ref 4.0–10.5)

## 2022-07-11 LAB — BASIC METABOLIC PANEL
BUN: 16 mg/dL (ref 6–23)
CO2: 25 mEq/L (ref 19–32)
Calcium: 10.2 mg/dL (ref 8.4–10.5)
Chloride: 100 mEq/L (ref 96–112)
Creatinine, Ser: 1.01 mg/dL (ref 0.40–1.20)
GFR: 63.41 mL/min (ref >60.00)
Glucose, Bld: 84 mg/dL (ref 70–99)
Potassium: 3.4 mEq/L — ABNORMAL LOW (ref 3.5–5.1)
Sodium: 135 mEq/L (ref 135–145)

## 2022-07-11 LAB — LIPID PANEL
Cholesterol: 252 mg/dL — ABNORMAL HIGH (ref 0–200)
HDL: 53 mg/dL (ref >39.00)
LDL Cholesterol: 167 mg/dL — ABNORMAL HIGH (ref 0–99)
NonHDL: 199.42
Total CHOL/HDL Ratio: 5
Triglycerides: 163 mg/dL — ABNORMAL HIGH (ref 0.0–149.0)
VLDL: 32.6 mg/dL (ref 0.0–40.0)

## 2022-07-11 LAB — TSH: TSH: 0.77 u[IU]/mL (ref 0.35–5.50)

## 2022-07-11 LAB — HEMOGLOBIN A1C: Hgb A1c MFr Bld: 6.3 % (ref 4.6–6.5)

## 2022-07-11 MED ORDER — ATENOLOL 25 MG PO TABS: 25.0000 mg | ORAL_TABLET | Freq: Every day | ORAL | 0 refills | Status: DC

## 2022-07-11 NOTE — Patient Instructions (Signed)
Prescription for was sent to your local pharmacy for 30-day supply until your mail order pharmacy since you have a 90-day supply.

## 2022-07-11 NOTE — Progress Notes (Signed)
Subjective:     Monica Spencer is a 51 y.o. female and is here for a comprehensive physical exam. The patient reports doing well.  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  Patient endorses episode of clear drainage from the left nipple several months ago.  Patient states she noticed the fluid on her hand and was able to express some from her breast.  Patient has not noticed any other drainage since that episode.  Patient denies changes in medications or supplements.  Denies stimulation or wearing tight bra.    Patient has been off of atenolol for BP times a few days.  States BP at home typically 120s-130s/70s-low 80s.  Patient denies headaches, changes in vision, LE edema.  Endorses eating thanksgiving food which may have been a little softer than her normal diet.  Patient had pap last year and mammogram done this year with OB/GYN.  Seen in August for postmenopausal bleeding 2/2 polyp which was removed.  Social History   Socioeconomic History   Marital status: Married    Spouse name: Not on file   Number of children: Not on file   Years of education: Not on file   Highest education level: Not on file  Occupational History   Not on file  Tobacco Use   Smoking status: Never   Smokeless tobacco: Never  Vaping Use   Vaping Use: Never used  Substance and Sexual Activity   Alcohol use: Not Currently    Alcohol/week: 1.0 standard drink of alcohol    Types: 1 Glasses of wine per week    Comment: once a year or so   Drug use: Never   Sexual activity: Not on file  Other Topics Concern   Not on file  Social History Narrative   Not on file   Social Determinants of Health   Financial Resource Strain: Not on file  Food Insecurity: Not on file  Transportation Needs: Not on file  Physical Activity: Not on file  Stress: Not on file  Social Connections: Not on file  Intimate Partner Violence: Not on file   Health Maintenance  Topic Date  Due   HIV Screening  Never done   Hepatitis C Screening  Never done   Zoster Vaccines- Shingrix (1 of 2) Never done   PAP SMEAR-Modifier  02/26/2022   COVID-19 Vaccine (8 - 2023-24 season) 04/15/2022   INFLUENZA VACCINE  11/13/2022 (Originally 03/15/2022)   MAMMOGRAM  03/19/2023   COLONOSCOPY (Pts 45-12yr Insurance coverage will need to be confirmed)  03/23/2026   HPV VACCINES  Aged Out    The following portions of the patient's history were reviewed and updated as appropriate: allergies, current medications, past family history, past medical history, past social history, past surgical history, and problem list.  Review of Systems Pertinent items noted in HPI and remainder of comprehensive ROS otherwise negative.   Objective:    BP (!) 142/90 (BP Location: Left Arm, Patient Position: Sitting, Cuff Size: Large)   Pulse 83   Temp 98.1 F (36.7 C) (Oral)   Wt 245 lb 9.6 oz (111.4 kg)   LMP 11/19/2019 (Exact Date)   SpO2 98%   BMI 40.87 kg/m  General appearance: alert, cooperative, and no distress Head: Normocephalic, without obvious abnormality, atraumatic Eyes: conjunctivae/corneas clear. PERRL, EOM's intact. Fundi benign. Ears: normal TM's and external ear canals both ears Nose: Nares normal. Septum midline. Mucosa normal. No drainage or sinus tenderness. Throat: lips, mucosa,  and tongue normal; teeth and gums normal Neck: no adenopathy, no carotid bruit, no JVD, supple, symmetrical, trachea midline, and thyroid not enlarged, symmetric, no tenderness/mass/nodules Lungs: clear to auscultation bilaterally Heart: regular rate and rhythm, S1, S2 normal, no murmur, click, rub or gallop Abdomen: soft, non-tender; bowel sounds normal; no masses,  no organomegaly Extremities: extremities normal, atraumatic, no cyanosis or edema Pulses: 2+ and symmetric Skin: Skin color, texture, turgor normal. No rashes or lesions Lymph nodes: Cervical, supraclavicular, and axillary nodes  normal. Neurologic: Alert and oriented X 3, normal strength and tone. Normal symmetric reflexes. Normal coordination and gait    Assessment:    Healthy female exam.      Plan:    Anticipatory guidance given including wearing seatbelts, smoke detectors in the home, increasing physical activity, increasing p.o. intake of water and vegetables. -Labs -Immunizations reviewed -Colonoscopy done 03/23/2021 -Last mamm[facility]/20[facility] -Last pap 02/26/2020 with [facility].  LMP 11/19/2019. -Given handout -Next CPE in 1 year See After Visit Summary for Counseling Recommendations  Well adult exam  Essential hypertension -Elevated 2/2 pt running out of medication -New prescription sent into mail order pharmacy last week but is yet to arrive.  Will send 30-day supply to local pharmacy -Patient encouraged to continue checking BP at home and keep a log of readings to bring to next office visit. -Continue atenolol 25 mg daily - Plan: CBC with Differential/Platelet, Basic metabolic panel, atenolol (TENORMIN) 25 MG tablet  Postoperative hypothyroidism -Continue Synthroid 100 mcg daily  - Plan: TSH  Mixed hyperlipidemia  - Plan: Hemoglobin A1c, Lipid panel  Encounter for hepatitis C screening test for low risk patient - Plan: Hep C Antibody  Nipple discharge -Resolved -Mammogram done [facility]. -Continue to monitor -For continued symptoms repeat mammogram/obtain ultrasound  F/u in 4-6 months for HTN  Grier Mitts, MD

## 2022-07-12 LAB — HEPATITIS C ANTIBODY: Hepatitis C Ab: NONREACTIVE

## 2022-07-13 ENCOUNTER — Other Ambulatory Visit: Payer: Self-pay | Admitting: Obstetrics and Gynecology

## 2022-07-13 DIAGNOSIS — N6452 Nipple discharge: Secondary | ICD-10-CM

## 2022-07-14 ENCOUNTER — Other Ambulatory Visit: Payer: Self-pay | Admitting: Family Medicine

## 2022-07-14 DIAGNOSIS — E876 Hypokalemia: Secondary | ICD-10-CM

## 2022-07-14 MED ORDER — POTASSIUM CHLORIDE CRYS ER 20 MEQ PO TBCR: 20.0000 meq | EXTENDED_RELEASE_TABLET | Freq: Every day | ORAL | 0 refills | Status: DC

## 2022-07-14 NOTE — Telephone Encounter (Signed)
[facility] supply [facility].  Pt needs to be seen in office for further refills.

## 2022-07-16 ENCOUNTER — Other Ambulatory Visit: Payer: Self-pay | Admitting: Family Medicine

## 2022-07-16 DIAGNOSIS — I10 Essential (primary) hypertension: Secondary | ICD-10-CM

## 2022-07-28 ENCOUNTER — Other Ambulatory Visit: Payer: Self-pay | Admitting: Obstetrics and Gynecology

## 2022-07-28 ENCOUNTER — Ambulatory Visit
Admission: RE | Admit: 2022-07-28 | Discharge: 2022-07-28 | Disposition: A | Discharge status: Home / Self Care | Payer: No Typology Code available for payment source | Source: Ambulatory Visit | Attending: Obstetrics and Gynecology | Admitting: Obstetrics and Gynecology

## 2022-07-28 DIAGNOSIS — N6452 Nipple discharge: Secondary | ICD-10-CM

## 2022-07-28 DIAGNOSIS — N6320 Unspecified lump in the left breast, unspecified quadrant: Secondary | ICD-10-CM

## 2022-08-04 ENCOUNTER — Other Ambulatory Visit: Payer: Self-pay | Admitting: Family Medicine

## 2022-08-04 DIAGNOSIS — I10 Essential (primary) hypertension: Secondary | ICD-10-CM

## 2022-08-11 ENCOUNTER — Ambulatory Visit
Admission: RE | Admit: 2022-08-11 | Discharge: 2022-08-11 | Disposition: A | Discharge status: Home / Self Care | Payer: No Typology Code available for payment source | Source: Ambulatory Visit | Attending: Obstetrics and Gynecology | Admitting: Obstetrics and Gynecology

## 2022-08-11 DIAGNOSIS — N6320 Unspecified lump in the left breast, unspecified quadrant: Secondary | ICD-10-CM

## 2022-08-11 DIAGNOSIS — N6452 Nipple discharge: Secondary | ICD-10-CM

## 2022-08-11 HISTORY — PX: BREAST BIOPSY: SHX20

## 2022-08-12 ENCOUNTER — Ambulatory Visit (INDEPENDENT_AMBULATORY_CARE_PROVIDER_SITE_OTHER): Payer: No Typology Code available for payment source

## 2022-08-12 ENCOUNTER — Ambulatory Visit
Admission: EM | Admit: 2022-08-12 | Discharge: 2022-08-12 | Disposition: A | Discharge status: Home / Self Care | Payer: No Typology Code available for payment source | Attending: Family Medicine | Admitting: Family Medicine

## 2022-08-12 ENCOUNTER — Encounter: Payer: Self-pay | Admitting: Emergency Medicine

## 2022-08-12 DIAGNOSIS — S0011XA Contusion of right eyelid and periocular area, initial encounter: Secondary | ICD-10-CM

## 2022-08-12 DIAGNOSIS — R519 Headache, unspecified: Secondary | ICD-10-CM

## 2022-08-12 DIAGNOSIS — W1830XA Fall on same level, unspecified, initial encounter: Secondary | ICD-10-CM | POA: Diagnosis not present

## 2022-08-12 DIAGNOSIS — R55 Syncope and collapse: Secondary | ICD-10-CM | POA: Diagnosis not present

## 2022-08-12 DIAGNOSIS — R220 Localized swelling, mass and lump, head: Secondary | ICD-10-CM

## 2022-08-12 NOTE — ED Triage Notes (Signed)
Patient states that she was using the bathroom early Thursday morning, remembers using the bathroom then waken up on the bathroom floor.  Patient's right eye is swollen.  Denies any pain unless she touches the area.  Been applying ice to the area.

## 2022-08-12 NOTE — Discharge Instructions (Addendum)
Advised patient of facial bone x-ray results with hardcopy provided to patient.  Advised if symptoms worsen and/or unresolved please follow-up with PCP for possible CT of maxillary facial bones.  Advised patient may RICE affected area of right orbit for 20-25 minutes 3 times daily for the next 3 days.

## 2022-08-12 NOTE — ED Provider Notes (Signed)
[facility]    CSN: [id number] Arrival date & time: 08/12/22  0827      History   Chief Complaint Chief Complaint  Patient presents with   Loss of Consciousness    HPI Camile Esters is a 51 y.o. female.   HPI 51 year old female presents with loss of consciousness early yesterday Thursday, 08/11/2022.  Patient reports remembering using the bathroom at roughly 2 AM and then waking up on the bathroom floor.  Patient believes that she may have fell asleep while urinating and fell after falling asleep.  Patient right superior orbit has noticeable swollen.  Patient denies pain unless she touches this area.  PMH significant for anemia, HTN, and thyroid disease.  Past Medical History:  Diagnosis Date   Anemia 2004   uterine bleeding needed transfusion   Blood transfusion without reported diagnosis 2004   uterine fibroid bleeding   Hx of adenomatous polyp of colon 01/28/2016   Hypertension    Thyroid disease     Patient Active Problem List   Diagnosis Date Noted   Essential hypertension 03/26/2021   Menopause present 03/26/2021   S/P bilateral breast reduction 09/06/2019   Neck pain 03/19/2019   Back pain 03/19/2019   Symptomatic mammary hypertrophy 03/19/2019   Postoperative hypothyroidism 03/06/2019   Ductal papilloma, R breast 03/06/2019   Mixed hyperlipidemia 03/06/2019   History of prediabetes 03/06/2019   Hx of adenomatous polyp of colon 01/28/2016    Past Surgical History:  Procedure Laterality Date   BREAST BIOPSY Left 08/11/2022   [place] LT BREAST BX W LOC DEV 1ST LESION IMG BX SPEC US GUIDE 08/11/2022 [facility]   BREAST BIOPSY Left 08/11/2022   [place] LT BREAST BX W LOC DEV EA ADD LESION IMG BX SPEC US GUIDE 08/11/2022 [facility]   BREAST LUMPECTOMY WITH RADIOACTIVE SEED LOCALIZATION Right 05/01/2019   Procedure: RIGHT BREAST LUMPECTOMY WITH RADIOACTIVE SEED LOCALIZATION X 2;  Surgeon: Jovita Kussmaul, MD;  Location: [facility];  Service: General;  Laterality: Right;   BREAST REDUCTION SURGERY Bilateral 05/01/2019   Procedure: BILATERAL MAMMARY REDUCTION  (BREAST);  Surgeon: Wallace Going, DO;  Location: [facility];  Service: Plastics;  Laterality: Bilateral;   ENDOMETRIAL ABLATION W/ NOVASURE     KNEE ARTHROSCOPY Left    MYOMECTOMY     THYROIDECTOMY      OB History   No obstetric history on file.      Home Medications    Prior to Admission medications   Medication Sig Start Date End Date Taking? Authorizing Provider  atenolol (TENORMIN) 25 MG tablet TAKE 1 TABLET DAILY 07/18/22  Yes Billie Ruddy, MD  atenolol (TENORMIN) 25 MG tablet TAKE 1 TABLET (25 MG TOTAL) BY MOUTH DAILY. 08/05/22  Yes Billie Ruddy, MD  levothyroxine (SYNTHROID) 100 MCG tablet TAKE 1 TABLET DAILY BEFORE BREAKFAST--NEED VISIT-- 06/27/22  Yes Billie Ruddy, MD  OVER THE COUNTER MEDICATION Centrum -Take 1 tablet by mouth daily.   Yes [provider]  Cholecalciferol 125 MCG (5000 UT) capsule Take 5,000 Units by mouth daily. Patient not taking: Reported on 03/26/2021    [provider]  diclofenac (VOLTAREN) 75 MG EC tablet Take 1 tablet (75 mg total) by mouth 2 (two) times daily. Patient not taking: Reported on 07/11/2022 04/19/21   Raspet, Junie Panning K, PA-C  potassium chloride SA (KLOR-CON M) 20 MEQ tablet Take 1 tablet (20 mEq total) by mouth daily for 4 days. 07/14/22  07/18/22  Billie Ruddy, MD  tiZANidine (ZANAFLEX) 4 MG capsule Take 1 capsule (4 mg total) by mouth 3 (three) times daily. Patient not taking: Reported on 07/11/2022 04/19/21   Raspet, Derry Skill, PA-C    Family History Family History  Problem Relation Age of Onset   Colon polyps Mother    Colon cancer Mother    Colon polyps Father    Colon cancer Father    Colon polyps Sister    Colon polyps Brother    Colon cancer Brother    Breast cancer Maternal Grandmother 53   Esophageal cancer Neg Hx    Rectal cancer  Neg Hx    Stomach cancer Neg Hx     Social History Social History   Tobacco Use   Smoking status: Never   Smokeless tobacco: Never  Vaping Use   Vaping Use: Never used  Substance Use Topics   Alcohol use: Not Currently    Alcohol/week: 1.0 standard drink of alcohol    Types: 1 Glasses of wine per week    Comment: once a year or so   Drug use: Never     Allergies   Patient has no known allergies.   Review of Systems Review of Systems  HENT:  Positive for facial swelling.      Physical Exam Triage Vital Signs ED Triage Vitals  Enc Vitals Group     BP 08/12/22 0841 (!) 177/105     Pulse Rate 08/12/22 0841 80     Resp 08/12/22 0841 18     Temp 08/12/22 0841 99.4 F (37.4 C)     Temp Source 08/12/22 0841 Oral     SpO2 08/12/22 0841 96 %     Weight 08/12/22 0843 243 lb (110.2 kg)     Height 08/12/22 0843 _0  (1.651 m)     Head Circumference --      Peak Flow --      Pain Score 08/12/22 0843 0     Pain Loc --      Pain Edu? --      Excl. in [place]? --    No data found.  Updated Vital Signs BP (!) 160/90 (BP Location: Right Arm)   Pulse 80   Temp 99.4 F (37.4 C) (Oral)   Resp 18   Ht _1  (1.651 m)   Wt 243 lb (110.2 kg)   LMP 11/19/2019 (Exact Date)   SpO2 96%   BMI 40.44 kg/m   Visual Acuity Right Eye Distance:   Left Eye Distance:   Bilateral Distance:    Right Eye Near:   Left Eye Near:    Bilateral Near:     Physical Exam Vitals and nursing note reviewed.  Constitutional:      General: She is not in acute distress.    Appearance: Normal appearance. She is obese. She is not ill-appearing.  HENT:     Head: Normocephalic and atraumatic.     Comments: Right superior orbit swelling prominent-please see image below    Right Ear: Tympanic membrane, ear canal and external ear normal.     Left Ear: Tympanic membrane, ear canal and external ear normal.     Mouth/Throat:     Mouth: Mucous membranes are moist.     Pharynx: Oropharynx is clear.   Eyes:     Extraocular Movements: Extraocular movements intact.     Conjunctiva/sclera: Conjunctivae normal.     Pupils: Pupils are equal, round, and reactive to light.  Neck:     Vascular: No carotid bruit.     Comments: JVD, no bruit Cardiovascular:     Rate and Rhythm: Normal rate and regular rhythm.     Pulses: Normal pulses.     Heart sounds: Normal heart sounds.  Pulmonary:     Effort: Pulmonary effort is normal.     Breath sounds: Normal breath sounds. No wheezing or rhonchi.  Musculoskeletal:        General: Normal range of motion.     Cervical back: Normal range of motion and neck supple.  Lymphadenopathy:     Cervical: No cervical adenopathy.  Skin:    General: Skin is warm and dry.  Neurological:     General: No focal deficit present.     Mental Status: She is alert and oriented to person, place, and time. Mental status is at baseline.     Cranial Nerves: No cranial nerve deficit.     Sensory: No sensory deficit.     Motor: No weakness.     Coordination: Coordination normal.     Gait: Gait normal.  Psychiatric:        Mood and Affect: Mood normal.        Behavior: Behavior normal.        Thought Content: Thought content normal.      UC Treatments / Results  Labs (all labs ordered are listed, but only abnormal results are displayed) Labs Reviewed - No data to display  EKG   Radiology DG Facial Bones Complete  Result Date: 08/12/2022 CLINICAL DATA:  52 year old female with syncope, fall onto bathroom floor yesterday. Right forehead, periorbital hematoma and ecchymosis. EXAM: FACIAL BONES COMPLETE 3+V COMPARISON:  None Available. FINDINGS: Bone mineralization is within normal limits. No skull fracture identified radiographically. And no facial bone fracture identified. No posttraumatic gas within the right orbit. Paranasal sinus and mastoid pneumatization appears symmetric. IMPRESSION: No skull or facial bone fracture identified radiographically. If symptoms  persist noncontrast maxillofacial CT would evaluate with the highest sensitivity. Electronically Signed   By: Genevie Ann M.D.   On: 08/12/2022 09:48   MM CLIP PLACEMENT LEFT  Result Date: 08/11/2022 CLINICAL DATA:  Status post ultrasound-guided core needle biopsies of a 5 mm intraductal mass in the 9 o'clock retroareolar left breast marked with a ribbon shaped clip and a 1.5 cm complex cystic mass in the 2 o'clock position of the left breast marked with a coil shaped clip. EXAM: 3D DIAGNOSTIC RIGHT MAMMOGRAM POST ULTRASOUND BIOPSY x 2 COMPARISON:  Previous exam(s). FINDINGS: 3D Mammographic images were obtained following ultrasound guided biopsies of masses in the 9 o'clock 2 o'clock positions of the left breast. The ribbon shaped biopsy marker clip is at the expected location biopsied mass in the 9 o'clock retroareolar left breast. The coil shaped clip is located 1.2 cm superior 1.0 cm posterolateral to residual calcifications at the biopsy site in the 2 o'clock position of the left breast. Some of the previous calcifications are absent following biopsy. IMPRESSION: 1. Appropriate positioning of the ribbon shaped biopsy marking clip at the site of biopsy in the 9 o'clock retroareolar left breast. 2. 1.2 cm of migration of the coil shaped biopsy marker clip, as described above. There are residual calcifications at the biopsy site in the 2 o'clock position of the left breast which can be used for localization purposes if needed based on the pathology results. 3. The biopsy marker clips are located 13.1 cm apart Final Assessment: Post Procedure Mammograms  for Marker Placement Electronically Signed   By: Claudie Revering M.D.   On: 08/11/2022 10:04  [place] LT BREAST BX W LOC DEV 1ST LESION IMG BX SPEC US GUIDE  Result Date: 08/11/2022 CLINICAL DATA:  5 mm probable intraductal mass in the 9 o'clock retroareolar left breast and 1.5 cm complex cystic mass in the 2 o'clock position of the left breast at recent mammography  and ultrasound for clear left nipple discharge. The patient has had bilateral breast papillomas resected in the past. EXAM: ULTRASOUND GUIDED LEFT BREAST CORE NEEDLE BIOPSY X 2 COMPARISON:  Previous exam(s). PROCEDURE: I met with the patient and we discussed the procedure of ultrasound-guided biopsy, including benefits and alternatives. We discussed the high likelihood of successful procedures. We discussed the risks of the procedures, including infection, bleeding, tissue injury, clip migration, and inadequate sampling. Informed written consent was given. The usual time-out protocol was performed immediately prior to the procedures. #1: 5 MM INTRADUCTAL MASS IN THE 9 O'CLOCK RETROAREOLAR LEFT BREAST Using sterile technique and 1% Lidocaine as local anesthetic, under direct ultrasound visualization, a 12 gauge spring-loaded device was used to perform biopsy of the recently demonstrated 5 mm intraductal mass in the 9 o'clock retroareolar left breast using a caudal approach. At the conclusion of the procedure a ribbon shaped tissue marker clip was deployed into the biopsy cavity. #2: 1.5 CM COMPLEX CYSTIC MASS IN THE 2 O'CLOCK POSITION OF THE LEFT BREAST Lesion quadrant: Upper outer quadrant Using sterile technique and 1% Lidocaine as local anesthetic, under direct ultrasound visualization, a 12 gauge spring-loaded device was used to perform biopsy of the recently demonstrated 1.5 cm complex cystic mass in the 2 o'clock position of the left breast using a caudal approach. At the conclusion of the procedure a coil shaped tissue marker clip was deployed into the biopsy cavity. Follow up 2 view mammogram was performed and dictated separately. IMPRESSION: Ultrasound guided biopsies of masses in the 9 o'clock retroareolar 2 o'clock positions of the left breast. No apparent complications. Electronically Signed   By: Claudie Revering M.D.   On: 08/11/2022 09:19  [place] LT BREAST BX W LOC DEV EA ADD LESION IMG BX SPEC US  GUIDE  Result Date: 08/11/2022 CLINICAL DATA:  5 mm probable intraductal mass in the 9 o'clock retroareolar left breast and 1.5 cm complex cystic mass in the 2 o'clock position of the left breast at recent mammography and ultrasound for clear left nipple discharge. The patient has had bilateral breast papillomas resected in the past. EXAM: ULTRASOUND GUIDED LEFT BREAST CORE NEEDLE BIOPSY X 2 COMPARISON:  Previous exam(s). PROCEDURE: I met with the patient and we discussed the procedure of ultrasound-guided biopsy, including benefits and alternatives. We discussed the high likelihood of successful procedures. We discussed the risks of the procedures, including infection, bleeding, tissue injury, clip migration, and inadequate sampling. Informed written consent was given. The usual time-out protocol was performed immediately prior to the procedures. #1: 5 MM INTRADUCTAL MASS IN THE 9 O'CLOCK RETROAREOLAR LEFT BREAST Using sterile technique and 1% Lidocaine as local anesthetic, under direct ultrasound visualization, a 12 gauge spring-loaded device was used to perform biopsy of the recently demonstrated 5 mm intraductal mass in the 9 o'clock retroareolar left breast using a caudal approach. At the conclusion of the procedure a ribbon shaped tissue marker clip was deployed into the biopsy cavity. #2: 1.5 CM COMPLEX CYSTIC MASS IN THE 2 O'CLOCK POSITION OF THE LEFT BREAST Lesion quadrant: Upper outer quadrant Using sterile  technique and 1% Lidocaine as local anesthetic, under direct ultrasound visualization, a 12 gauge spring-loaded device was used to perform biopsy of the recently demonstrated 1.5 cm complex cystic mass in the 2 o'clock position of the left breast using a caudal approach. At the conclusion of the procedure a coil shaped tissue marker clip was deployed into the biopsy cavity. Follow up 2 view mammogram was performed and dictated separately. IMPRESSION: Ultrasound guided biopsies of masses in the 9  o'clock retroareolar 2 o'clock positions of the left breast. No apparent complications. Electronically Signed   By: Claudie Revering M.D.   On: 08/11/2022 09:19   Procedures Procedures (including critical care time)  Medications Ordered in UC Medications - No data to display  Initial Impression / Assessment and Plan / UC Course  I have reviewed the triage vital signs and the nursing notes.  Pertinent labs & imaging results that were available during my care of the patient were reviewed by me and considered in my medical decision making (see chart for details).     MDM: 1.  Syncope and collapse-physical exam unremarkable other than right orbit swelling; 2.  Referred facial pain-x-ray of facial bones revealed above, 3.  Right facial swelling-advised patient to RICE area for 20-25 minutes 3 times daily for the next 3 days. Advised patient of facial bone x-ray results with hardcopy provided to patient.  Advised if symptoms worsen and/or unresolved please follow-up with PCP for possible CT of maxillary facial bones.  Advised patient may RICE affected area of right orbit for 20-25 minutes 3 times daily for the next 3 days.  Discharged home, hemodynamically stable. Final Clinical Impressions(s) / UC Diagnoses   Final diagnoses:  Referred facial pain  Syncope and collapse  Right facial swelling     Discharge Instructions      Advised patient of facial bone x-ray results with hardcopy provided to patient.  Advised if symptoms worsen and/or unresolved please follow-up with PCP for possible CT of maxillary facial bones.  Advised patient may RICE affected area of right orbit for 20-25 minutes 3 times daily for the next 3 days.     ED Prescriptions   None    PDMP not reviewed this encounter.   Eliezer Lofts, [place] 08/12/22 1022

## 2022-08-16 ENCOUNTER — Other Ambulatory Visit: Payer: No Typology Code available for payment source

## 2022-09-01 ENCOUNTER — Ambulatory Visit
Admission: EM | Admit: 2022-09-01 | Discharge: 2022-09-01 | Disposition: A | Discharge status: Home / Self Care | Payer: No Typology Code available for payment source | Attending: Emergency Medicine | Admitting: Emergency Medicine

## 2022-09-01 DIAGNOSIS — J3089 Other allergic rhinitis: Secondary | ICD-10-CM | POA: Diagnosis not present

## 2022-09-01 DIAGNOSIS — J302 Other seasonal allergic rhinitis: Secondary | ICD-10-CM | POA: Diagnosis not present

## 2022-09-01 MED ORDER — FLUTICASONE PROPIONATE 50 MCG/ACT NA SUSP: 1.0000 | Freq: Every day | NASAL | 1 refills | Status: DC

## 2022-09-01 MED ORDER — CETIRIZINE HCL 10 MG PO TABS: 10.0000 mg | ORAL_TABLET | Freq: Every day | ORAL | 1 refills | Status: DC

## 2022-09-01 NOTE — ED Triage Notes (Signed)
Pt c/o dry cough x 3 weeks. Denies fever. Taking delsym prn.

## 2022-09-01 NOTE — Discharge Instructions (Signed)
Your symptoms and my physical exam findings are concerning for exacerbation of your underlying allergies.    To avoid catching frequent respiratory infections, having skin reactions, dealing with eye irritation, losing sleep, missing work, etc., due to uncontrolled allergies, it is important that you begin/continue your allergy regimen and are consistent with taking your meds exactly as prescribed.   Please see the list below for recommended medications, dosages and frequencies to provide relief of current symptoms:     Zyrtec (cetirizine): This is an excellent second-generation antihistamine that helps to reduce respiratory inflammatory response to environmental allergens.  In some patients, this medication can cause daytime sleepiness so I recommend that you take 1 tablet daily at bedtime.     Flonase (fluticasone): This is a steroid nasal spray that you use once daily, 1 spray in each nare.  This medication does not work well if you decide to use it only used as you feel you need to, it works best used on a daily basis.  After 3 to 5 days of use, you will notice significant reduction of the inflammation and mucus production that is currently being caused by exposure to allergens, whether seasonal or environmental.  The most common side effect of this medication is nosebleeds.  If you experience a nosebleed, please discontinue use for 1 week, then feel free to resume.  I have provided you with a prescription.     If you find that you have not had improvement of your symptoms in the next 5 to 7 days, please follow-up with your primary care provider or return here to urgent care for repeat evaluation and further recommendations.   Thank you for visiting urgent care today.  We appreciate the opportunity to participate in your care.

## 2022-09-01 NOTE — ED Provider Notes (Signed)
Monica Spencer CARE    CSN: [id number] Arrival date & time: 09/01/22  1836    HISTORY   Chief Complaint  Patient presents with   Cough   HPI Monica Spencer is a pleasant, 52 y.o. female who presents to urgent care today. Patient complains of a dry cough for the past 3 weeks.  Patient has normal vital signs on arrival today.  Patient endorses allergies to cats and dust.  Patient states she is not currently taking any allergy medications.  Patient denies fever, body aches, chills, nausea, vomiting, diarrhea, sore throat, ear pain, headache, otalgia, sinus pressure, nasal congestion, rhinorrhea.  The history is provided by the patient.   Past Medical History:  Diagnosis Date   Anemia 2004   uterine bleeding needed transfusion   Blood transfusion without reported diagnosis 2004   uterine fibroid bleeding   Hx of adenomatous polyp of colon 01/28/2016   Hypertension    Thyroid disease    Patient Active Problem List   Diagnosis Date Noted   Essential hypertension 03/26/2021   Menopause present 03/26/2021   S/P bilateral breast reduction 09/06/2019   Neck pain 03/19/2019   Back pain 03/19/2019   Symptomatic mammary hypertrophy 03/19/2019   Postoperative hypothyroidism 03/06/2019   Ductal papilloma, R breast 03/06/2019   Mixed hyperlipidemia 03/06/2019   History of prediabetes 03/06/2019   Hx of adenomatous polyp of colon 01/28/2016   Past Surgical History:  Procedure Laterality Date   BREAST BIOPSY Left 08/11/2022   [place] LT BREAST BX W LOC DEV 1ST LESION IMG BX SPEC US GUIDE 08/11/2022 [facility]   BREAST BIOPSY Left 08/11/2022   [place] LT BREAST BX W LOC DEV EA ADD LESION IMG BX SPEC US GUIDE 08/11/2022 [facility]   BREAST LUMPECTOMY WITH RADIOACTIVE SEED LOCALIZATION Right 05/01/2019   Procedure: RIGHT BREAST LUMPECTOMY WITH RADIOACTIVE SEED LOCALIZATION X 2;  Surgeon: Monica Kussmaul, MD;  Location: [place];  Service: General;   Laterality: Right;   BREAST REDUCTION SURGERY Bilateral 05/01/2019   Procedure: BILATERAL MAMMARY REDUCTION  (BREAST);  Surgeon: Monica Going, DO;  Location: [place];  Service: Plastics;  Laterality: Bilateral;   ENDOMETRIAL ABLATION W/ NOVASURE     KNEE ARTHROSCOPY Left    MYOMECTOMY     THYROIDECTOMY     OB History   No obstetric history on file.    Home Medications    Prior to Admission medications   Medication Sig Start Date End Date Taking? Authorizing Provider  atenolol (TENORMIN) 25 MG tablet TAKE 1 TABLET DAILY 07/18/22   Monica Ruddy, MD  atenolol (TENORMIN) 25 MG tablet TAKE 1 TABLET (25 MG TOTAL) BY MOUTH DAILY. 08/05/22   Monica Ruddy, MD  Cholecalciferol 125 MCG (5000 UT) capsule Take 5,000 Units by mouth daily. Patient not taking: Reported on 03/26/2021    [provider]  diclofenac (VOLTAREN) 75 MG EC tablet Take 1 tablet (75 mg total) by mouth 2 (two) times daily. Patient not taking: Reported on 07/11/2022 04/19/21   Monica, Derry Skill, PA-C  levothyroxine (SYNTHROID) 100 MCG tablet TAKE 1 TABLET DAILY BEFORE BREAKFAST--NEED VISIT-- 06/27/22   Monica Ruddy, MD  OVER THE COUNTER MEDICATION Centrum -Take 1 tablet by mouth daily.    [provider]  potassium chloride SA (KLOR-CON M) 20 MEQ tablet Take 1 tablet (20 mEq total) by mouth daily for 4 days. 07/14/22 07/18/22  Monica Ruddy, MD  tiZANidine (ZANAFLEX) 4 MG  capsule Take 1 capsule (4 mg total) by mouth 3 (three) times daily. Patient not taking: Reported on 07/11/2022 04/19/21   Monica, Derry Skill, PA-C    Family History Family History  Problem Relation Age of Onset   Colon polyps Mother    Colon cancer Mother    Colon polyps Father    Colon cancer Father    Colon polyps Sister    Colon polyps Brother    Colon cancer Brother    Breast cancer Maternal Grandmother 32   Esophageal cancer Neg Hx    Rectal cancer Neg Hx    Stomach cancer Neg Hx    Social  History Social History   Tobacco Use   Smoking status: Never   Smokeless tobacco: Never  Vaping Use   Vaping Use: Never used  Substance Use Topics   Alcohol use: Not Currently    Alcohol/week: 1.0 standard drink of alcohol    Types: 1 Glasses of wine per week    Comment: once a year or so   Drug use: Never   Allergies   Patient has no known allergies.  Review of Systems Review of Systems Pertinent findings revealed after performing a 14 point review of systems has been noted in the history of present illness.  Physical Exam Triage Vital Signs ED Triage Vitals  Enc Vitals Group     BP 06/11/21 0827 (!) 147/82     Pulse Rate 06/11/21 0827 72     Resp 06/11/21 0827 18     Temp 06/11/21 0827 98.3 F (36.8 C)     Temp Source 06/11/21 0827 Oral     SpO2 06/11/21 0827 98 %     Weight --      Height --      Head Circumference --      Peak Flow --      Pain Score 06/11/21 0826 5     Pain Loc --      Pain Edu? --      Excl. in [place]? --   No data found.  Updated Vital Signs BP (!) 152/95 (BP Location: Right Arm)   Pulse 83   Temp 99 F (37.2 C) (Oral)   Resp 18   LMP 11/19/2019 (Exact Date)   SpO2 96%   Physical Exam Vitals and nursing note reviewed.  Constitutional:      General: She is not in acute distress.    Appearance: Normal appearance. She is not ill-appearing.  HENT:     Head: Normocephalic and atraumatic.     Salivary Glands: Right salivary gland is not diffusely enlarged or tender. Left salivary gland is not diffusely enlarged or tender.     Right Ear: Ear canal and external ear normal. No drainage. A middle ear effusion is present. There is no impacted cerumen. Tympanic membrane is bulging. Tympanic membrane is not injected or erythematous.     Left Ear: Ear canal and external ear normal. No drainage. A middle ear effusion is present. There is no impacted cerumen. Tympanic membrane is bulging. Tympanic membrane is not injected or erythematous.     Ears:      Comments: Bilateral EACs normal, both TMs bulging with clear fluid    Nose: No nasal deformity, septal deviation, signs of injury, nasal tenderness, mucosal edema, congestion or rhinorrhea.     Right Nostril: Occlusion present. No foreign body, epistaxis or septal hematoma.     Left Nostril: Occlusion present. No foreign body, epistaxis or septal hematoma.  Right Turbinates: Enlarged, swollen and pale.     Left Turbinates: Enlarged, swollen and pale.     Right Sinus: No maxillary sinus tenderness or frontal sinus tenderness.     Left Sinus: No maxillary sinus tenderness or frontal sinus tenderness.     Mouth/Throat:     Lips: Pink. No lesions.     Mouth: Mucous membranes are moist. No oral lesions.     Pharynx: Oropharynx is clear. Uvula midline. No posterior oropharyngeal erythema or uvula swelling.     Tonsils: No tonsillar exudate. 0 on the right. 0 on the left.     Comments: Postnasal drip Eyes:     General: Lids are normal.        Right eye: No discharge.        Left eye: No discharge.     Extraocular Movements: Extraocular movements intact.     Conjunctiva/sclera: Conjunctivae normal.     Right eye: Right conjunctiva is not injected.     Left eye: Left conjunctiva is not injected.  Neck:     Trachea: Trachea and phonation normal.  Cardiovascular:     Rate and Rhythm: Normal rate and regular rhythm.     Pulses: Normal pulses.     Heart sounds: Normal heart sounds. No murmur heard.    No friction rub. No gallop.  Pulmonary:     Effort: Pulmonary effort is normal. No accessory muscle usage, prolonged expiration or respiratory distress.     Breath sounds: Normal breath sounds. No stridor, decreased air movement or transmitted upper airway sounds. No decreased breath sounds, wheezing, rhonchi or rales.  Chest:     Chest wall: No tenderness.  Musculoskeletal:        General: Normal range of motion.     Cervical back: Normal range of motion and neck supple. Normal range of  motion.  Lymphadenopathy:     Cervical: No cervical adenopathy.  Skin:    General: Skin is warm and dry.     Findings: No erythema or rash.  Neurological:     General: No focal deficit present.     Mental Status: She is alert and oriented to person, place, and time.  Psychiatric:        Mood and Affect: Mood normal.        Behavior: Behavior normal.     Visual Acuity Right Eye Distance:   Left Eye Distance:   Bilateral Distance:    Right Eye Near:   Left Eye Near:    Bilateral Near:     UC Couse / Diagnostics / Procedures:     Radiology No results found.  Procedures Procedures (including critical care time) EKG  Pending results:  Labs Reviewed - No data to display  Medications Ordered in UC: Medications - No data to display  UC Diagnoses / Final Clinical Impressions(s)   I have reviewed the triage vital signs and the nursing notes.  Pertinent labs & imaging results that were available during my care of the patient were reviewed by me and considered in my medical decision making (see chart for details).    Final diagnoses:  Perennial allergic rhinitis with seasonal variation   Patient advised of physical exam findings concerning for allergies.  Patient provided with prescriptions for Flonase and Zyrtec.  Return precautions advised.  Please see discharge instructions below for further details of plan of care as provided to patient. ED Prescriptions     Medication Sig Dispense Auth. Provider   fluticasone Asencion Islam)  50 MCG/ACT nasal spray Place 1 spray into both nostrils daily. 47.4 mL Lynden Oxford Scales, PA-C   cetirizine (ZYRTEC ALLERGY) 10 MG tablet Take 1 tablet (10 mg total) by mouth at bedtime. 90 tablet Lynden Oxford Scales, [place]      PDMP not reviewed this encounter.  Disposition Upon Discharge:  Condition: stable for discharge home Home: take medications as prescribed; routine discharge instructions as discussed; follow up as  advised.  Patient presented with an acute illness with associated systemic symptoms and significant discomfort requiring urgent management. In my opinion, this is a condition that a prudent lay person (someone who possesses an average knowledge of health and medicine) may potentially expect to result in complications if not addressed urgently such as respiratory distress, impairment of bodily function or dysfunction of bodily organs.   Routine symptom specific, illness specific and/or disease specific instructions were discussed with the patient and/or caregiver at length.   As such, the patient has been evaluated and assessed, work-up was performed and treatment was provided in alignment with urgent care protocols and evidence based medicine.  Patient/parent/caregiver has been advised that the patient may require follow up for further testing and treatment if the symptoms continue in spite of treatment, as clinically indicated and appropriate.  If the patient was tested for COVID-19, Influenza and/or RSV, then the patient/parent/guardian was advised to isolate at home pending the results of his/her diagnostic coronavirus test and potentially longer if they're positive. I have also advised pt that if his/her COVID-19 test returns positive, it's recommended to self-isolate for at least 10 days after symptoms first appeared AND until fever-free for 24 hours without fever reducer AND other symptoms have improved or resolved. Discussed self-isolation recommendations as well as instructions for household member/close contacts as per the [facility] and [place] DHHS, and also gave patient the [place] packet with this information.  Patient/parent/caregiver has been advised to return to the [facility] or PCP in 3-5 days if no better; to PCP or the Emergency Department if new signs and symptoms develop, or if the current signs or symptoms continue to change or worsen for further workup, evaluation and treatment as clinically indicated  and appropriate  The patient will follow up with their current PCP if and as advised. If the patient does not currently have a PCP we will assist them in obtaining one.   The patient may need specialty follow up if the symptoms continue, in spite of conservative treatment and management, for further workup, evaluation, consultation and treatment as clinically indicated and appropriate.  Patient/parent/caregiver verbalized understanding and agreement of plan as discussed.  All questions were addressed during visit.  Please see discharge instructions below for further details of plan.  Discharge Instructions:   Discharge Instructions      Your symptoms and my physical exam findings are concerning for exacerbation of your underlying allergies.    To avoid catching frequent respiratory infections, having skin reactions, dealing with eye irritation, losing sleep, missing work, etc., due to uncontrolled allergies, it is important that you begin/continue your allergy regimen and are consistent with taking your meds exactly as prescribed.   Please see the list below for recommended medications, dosages and frequencies to provide relief of current symptoms:     Zyrtec (cetirizine): This is an excellent second-generation antihistamine that helps to reduce respiratory inflammatory response to environmental allergens.  In some patients, this medication can cause daytime sleepiness so I recommend that you take 1 tablet daily at bedtime.  Flonase (fluticasone): This is a steroid nasal spray that you use once daily, 1 spray in each nare.  This medication does not work well if you decide to use it only used as you feel you need to, it works best used on a daily basis.  After 3 to 5 days of use, you will notice significant reduction of the inflammation and mucus production that is currently being caused by exposure to allergens, whether seasonal or environmental.  The most common side effect of this  medication is nosebleeds.  If you experience a nosebleed, please discontinue use for 1 week, then feel free to resume.  I have provided you with a prescription.     If you find that you have not had improvement of your symptoms in the next 5 to 7 days, please follow-up with your primary care provider or return here to urgent care for repeat evaluation and further recommendations.   Thank you for visiting urgent care today.  We appreciate the opportunity to participate in your care.       This office note has been dictated using Museum/gallery curator.  Unfortunately, this method of dictation can sometimes lead to typographical or grammatical errors.  I apologize for your inconvenience in advance if this occurs.  Please do not hesitate to reach out to me if clarification is needed.      Lynden Oxford Scales, [place] 09/01/22 1914

## 2022-10-11 ENCOUNTER — Ambulatory Visit: Payer: Self-pay | Admitting: General Surgery

## 2022-10-11 DIAGNOSIS — D242 Benign neoplasm of left breast: Secondary | ICD-10-CM

## 2022-10-13 ENCOUNTER — Other Ambulatory Visit: Payer: Self-pay | Admitting: General Surgery

## 2022-10-13 DIAGNOSIS — D242 Benign neoplasm of left breast: Secondary | ICD-10-CM

## 2022-11-21 ENCOUNTER — Other Ambulatory Visit: Payer: Self-pay

## 2022-11-21 ENCOUNTER — Encounter (HOSPITAL_BASED_OUTPATIENT_CLINIC_OR_DEPARTMENT_OTHER): Payer: Self-pay | Admitting: General Surgery

## 2022-11-23 ENCOUNTER — Encounter (HOSPITAL_BASED_OUTPATIENT_CLINIC_OR_DEPARTMENT_OTHER)
Admission: RE | Admit: 2022-11-23 | Discharge: 2022-11-23 | Disposition: A | Discharge status: Home / Self Care | Payer: No Typology Code available for payment source | Source: Ambulatory Visit | Attending: General Surgery | Admitting: General Surgery

## 2022-11-23 DIAGNOSIS — Z01810 Encounter for preprocedural cardiovascular examination: Secondary | ICD-10-CM | POA: Insufficient documentation

## 2022-11-23 DIAGNOSIS — D242 Benign neoplasm of left breast: Secondary | ICD-10-CM | POA: Diagnosis present

## 2022-11-23 DIAGNOSIS — Z6841 Body Mass Index (BMI) 40.0 and over, adult: Secondary | ICD-10-CM | POA: Diagnosis not present

## 2022-11-23 DIAGNOSIS — I10 Essential (primary) hypertension: Secondary | ICD-10-CM | POA: Diagnosis not present

## 2022-11-23 DIAGNOSIS — R222 Localized swelling, mass and lump, trunk: Secondary | ICD-10-CM | POA: Diagnosis not present

## 2022-11-23 DIAGNOSIS — E039 Hypothyroidism, unspecified: Secondary | ICD-10-CM | POA: Diagnosis not present

## 2022-11-23 NOTE — Progress Notes (Signed)

## 2022-11-24 ENCOUNTER — Ambulatory Visit
Admission: RE | Admit: 2022-11-24 | Discharge: 2022-11-24 | Disposition: A | Discharge status: Home / Self Care | Payer: No Typology Code available for payment source | Source: Ambulatory Visit | Attending: General Surgery | Admitting: General Surgery

## 2022-11-24 DIAGNOSIS — D242 Benign neoplasm of left breast: Secondary | ICD-10-CM

## 2022-11-24 HISTORY — PX: BREAST BIOPSY: SHX20

## 2022-11-25 ENCOUNTER — Ambulatory Visit
Admission: RE | Admit: 2022-11-25 | Discharge: 2022-11-25 | Disposition: A | Discharge status: Home / Self Care | Payer: No Typology Code available for payment source | Source: Ambulatory Visit | Attending: General Surgery | Admitting: General Surgery

## 2022-11-25 ENCOUNTER — Ambulatory Visit (HOSPITAL_BASED_OUTPATIENT_CLINIC_OR_DEPARTMENT_OTHER)
Admission: RE | Admit: 2022-11-25 | Discharge: 2022-11-25 | Disposition: A | Discharge status: Home / Self Care | Payer: No Typology Code available for payment source | Attending: General Surgery | Admitting: General Surgery

## 2022-11-25 ENCOUNTER — Other Ambulatory Visit: Payer: Self-pay

## 2022-11-25 ENCOUNTER — Encounter (HOSPITAL_BASED_OUTPATIENT_CLINIC_OR_DEPARTMENT_OTHER): Payer: Self-pay | Admitting: General Surgery

## 2022-11-25 ENCOUNTER — Encounter (HOSPITAL_BASED_OUTPATIENT_CLINIC_OR_DEPARTMENT_OTHER)
Admission: RE | Disposition: A | Discharge status: Home / Self Care | Payer: Self-pay | Source: Home / Self Care | Attending: General Surgery

## 2022-11-25 ENCOUNTER — Ambulatory Visit (HOSPITAL_BASED_OUTPATIENT_CLINIC_OR_DEPARTMENT_OTHER): Payer: No Typology Code available for payment source | Admitting: Anesthesiology

## 2022-11-25 DIAGNOSIS — Z6841 Body Mass Index (BMI) 40.0 and over, adult: Secondary | ICD-10-CM | POA: Insufficient documentation

## 2022-11-25 DIAGNOSIS — D242 Benign neoplasm of left breast: Secondary | ICD-10-CM | POA: Insufficient documentation

## 2022-11-25 DIAGNOSIS — Z01818 Encounter for other preprocedural examination: Secondary | ICD-10-CM

## 2022-11-25 DIAGNOSIS — R222 Localized swelling, mass and lump, trunk: Secondary | ICD-10-CM | POA: Insufficient documentation

## 2022-11-25 DIAGNOSIS — E039 Hypothyroidism, unspecified: Secondary | ICD-10-CM | POA: Insufficient documentation

## 2022-11-25 DIAGNOSIS — I10 Essential (primary) hypertension: Secondary | ICD-10-CM | POA: Insufficient documentation

## 2022-11-25 HISTORY — PX: MASS EXCISION: SHX2000

## 2022-11-25 HISTORY — PX: BREAST LUMPECTOMY WITH RADIOACTIVE SEED LOCALIZATION: SHX6424

## 2022-11-25 SURGERY — BREAST LUMPECTOMY WITH RADIOACTIVE SEED LOCALIZATION
Anesthesia: General | Site: Chest | Laterality: Left

## 2022-11-25 MED ORDER — FENTANYL CITRATE (PF) 100 MCG/2ML IJ SOLN
INTRAMUSCULAR | Status: DC | PRN
  Administered 2022-11-25: 25 ug via INTRAVENOUS
  Administered 2022-11-25: 50 ug via INTRAVENOUS
  Administered 2022-11-25: 25 ug via INTRAVENOUS
  Administered 2022-11-25: 50 ug via INTRAVENOUS

## 2022-11-25 MED ORDER — CEFAZOLIN SODIUM-DEXTROSE 2-4 GM/100ML-% IV SOLN
INTRAVENOUS | Status: AC
  Filled 2022-11-25: qty 100

## 2022-11-25 MED ORDER — ACETAMINOPHEN 500 MG PO TABS
ORAL_TABLET | ORAL | Status: AC
  Filled 2022-11-25: qty 2

## 2022-11-25 MED ORDER — DEXAMETHASONE SODIUM PHOSPHATE 10 MG/ML IJ SOLN
INTRAMUSCULAR | Status: AC
  Filled 2022-11-25: qty 1

## 2022-11-25 MED ORDER — CHLORHEXIDINE GLUCONATE CLOTH 2 % EX PADS: 6.0000 | MEDICATED_PAD | Freq: Once | CUTANEOUS | Status: DC

## 2022-11-25 MED ORDER — ONDANSETRON HCL 4 MG/2ML IJ SOLN: 4.0000 mg | Freq: Four times a day (QID) | INTRAMUSCULAR | Status: DC | PRN

## 2022-11-25 MED ORDER — PHENYLEPHRINE HCL (PRESSORS) 10 MG/ML IV SOLN
INTRAVENOUS | Status: DC | PRN
  Administered 2022-11-25 (×4): 80 ug via INTRAVENOUS

## 2022-11-25 MED ORDER — MIDAZOLAM HCL 5 MG/5ML IJ SOLN
INTRAMUSCULAR | Status: DC | PRN
  Administered 2022-11-25: 2 mg via INTRAVENOUS

## 2022-11-25 MED ORDER — BUPIVACAINE-EPINEPHRINE (PF) 0.5% -1:200000 IJ SOLN
INTRAMUSCULAR | Status: DC | PRN
  Administered 2022-11-25: 27 mL

## 2022-11-25 MED ORDER — FENTANYL CITRATE (PF) 100 MCG/2ML IJ SOLN
INTRAMUSCULAR | Status: AC
  Filled 2022-11-25: qty 2

## 2022-11-25 MED ORDER — BUPIVACAINE-EPINEPHRINE (PF) 0.5% -1:200000 IJ SOLN
INTRAMUSCULAR | Status: AC
  Filled 2022-11-25: qty 30

## 2022-11-25 MED ORDER — GABAPENTIN 300 MG PO CAPS
ORAL_CAPSULE | ORAL | Status: AC
  Filled 2022-11-25: qty 1

## 2022-11-25 MED ORDER — FENTANYL CITRATE (PF) 100 MCG/2ML IJ SOLN: 25.0000 ug | INTRAMUSCULAR | Status: DC | PRN

## 2022-11-25 MED ORDER — EPHEDRINE SULFATE (PRESSORS) 50 MG/ML IJ SOLN
INTRAMUSCULAR | Status: DC | PRN
  Administered 2022-11-25: 10 mg via INTRAVENOUS
  Administered 2022-11-25 (×2): 5 mg via INTRAVENOUS

## 2022-11-25 MED ORDER — OXYCODONE HCL 5 MG/5ML PO SOLN: 5.0000 mg | Freq: Once | ORAL | Status: DC | PRN

## 2022-11-25 MED ORDER — CEFAZOLIN SODIUM-DEXTROSE 2-4 GM/100ML-% IV SOLN
2.0000 g | INTRAVENOUS | Status: AC
  Administered 2022-11-25: 2 g via INTRAVENOUS

## 2022-11-25 MED ORDER — LIDOCAINE 2% (20 MG/ML) 5 ML SYRINGE
INTRAMUSCULAR | Status: AC
  Filled 2022-11-25: qty 5

## 2022-11-25 MED ORDER — OXYCODONE HCL 5 MG PO TABS: 5.0000 mg | ORAL_TABLET | Freq: Once | ORAL | Status: DC | PRN

## 2022-11-25 MED ORDER — 0.9 % SODIUM CHLORIDE (POUR BTL) OPTIME
TOPICAL | Status: DC | PRN
  Administered 2022-11-25: 1000 mL

## 2022-11-25 MED ORDER — LACTATED RINGERS IV SOLN: INTRAVENOUS | Status: DC

## 2022-11-25 MED ORDER — CELECOXIB 200 MG PO CAPS
200.0000 mg | ORAL_CAPSULE | ORAL | Status: AC
  Administered 2022-11-25: 200 mg via ORAL

## 2022-11-25 MED ORDER — OXYCODONE HCL 5 MG PO TABS
5.0000 mg | ORAL_TABLET | Freq: Four times a day (QID) | ORAL | 0 refills | Status: AC | PRN
Start: 2022-11-25 — End: ?

## 2022-11-25 MED ORDER — DEXAMETHASONE SODIUM PHOSPHATE 4 MG/ML IJ SOLN
INTRAMUSCULAR | Status: DC | PRN
  Administered 2022-11-25: 4 mg via INTRAVENOUS

## 2022-11-25 MED ORDER — PROPOFOL 10 MG/ML IV BOLUS
INTRAVENOUS | Status: DC | PRN
  Administered 2022-11-25: 200 mg via INTRAVENOUS

## 2022-11-25 MED ORDER — MIDAZOLAM HCL 2 MG/2ML IJ SOLN
INTRAMUSCULAR | Status: AC
  Filled 2022-11-25: qty 2

## 2022-11-25 MED ORDER — ONDANSETRON HCL 4 MG/2ML IJ SOLN
INTRAMUSCULAR | Status: DC | PRN
  Administered 2022-11-25: 4 mg via INTRAVENOUS

## 2022-11-25 MED ORDER — CELECOXIB 200 MG PO CAPS
ORAL_CAPSULE | ORAL | Status: AC
  Filled 2022-11-25: qty 1

## 2022-11-25 MED ORDER — ACETAMINOPHEN 500 MG PO TABS
1000.0000 mg | ORAL_TABLET | ORAL | Status: AC
  Administered 2022-11-25: 1000 mg via ORAL

## 2022-11-25 MED ORDER — BUPIVACAINE HCL (PF) 0.25 % IJ SOLN
INTRAMUSCULAR | Status: AC
  Filled 2022-11-25: qty 30

## 2022-11-25 MED ORDER — LIDOCAINE 2% (20 MG/ML) 5 ML SYRINGE
INTRAMUSCULAR | Status: DC | PRN
  Administered 2022-11-25: 60 mg via INTRAVENOUS

## 2022-11-25 MED ORDER — ONDANSETRON HCL 4 MG/2ML IJ SOLN
INTRAMUSCULAR | Status: AC
  Filled 2022-11-25: qty 2

## 2022-11-25 MED ORDER — GABAPENTIN 300 MG PO CAPS
300.0000 mg | ORAL_CAPSULE | ORAL | Status: AC
  Administered 2022-11-25: 300 mg via ORAL

## 2022-11-25 SURGICAL SUPPLY — 55 items
ADH SKN CLS APL DERMABOND .7 (GAUZE/BANDAGES/DRESSINGS) ×6
APL PRP STRL LF DISP 70% ISPRP (MISCELLANEOUS) ×2
APPLIER CLIP 9.375 MED OPEN (MISCELLANEOUS)
APR CLP MED 9.3 20 MLT OPN (MISCELLANEOUS)
BLADE SURG 10 STRL SS (BLADE) ×2 IMPLANT
BLADE SURG 15 STRL LF DISP TIS (BLADE) ×2 IMPLANT
BLADE SURG 15 STRL SS (BLADE) ×2
CANISTER SUC SOCK COL 7IN (MISCELLANEOUS) ×2 IMPLANT
CANISTER SUCT 1200ML W/VALVE (MISCELLANEOUS) ×2 IMPLANT
CHLORAPREP W/TINT 26 (MISCELLANEOUS) ×2 IMPLANT
CLIP APPLIE 9.375 MED OPEN (MISCELLANEOUS) IMPLANT
COVER BACK TABLE 60X90IN (DRAPES) ×2 IMPLANT
COVER MAYO STAND STRL (DRAPES) ×2 IMPLANT
COVER PROBE CYLINDRICAL 5X96 (MISCELLANEOUS) ×2 IMPLANT
DERMABOND ADVANCED .7 DNX12 (GAUZE/BANDAGES/DRESSINGS) ×2 IMPLANT
DRAPE LAPAROSCOPIC ABDOMINAL (DRAPES) ×2 IMPLANT
DRAPE LAPAROTOMY 100X72 PEDS (DRAPES) ×2 IMPLANT
DRAPE UTILITY XL STRL (DRAPES) ×2 IMPLANT
ELECT COATED BLADE 2.86 ST (ELECTRODE) ×2 IMPLANT
ELECT REM PT RETURN 9FT ADLT (ELECTROSURGICAL) ×2
ELECTRODE REM PT RTRN 9FT ADLT (ELECTROSURGICAL) ×2 IMPLANT
GAUZE 4X4 16PLY ~~LOC~~+RFID DBL (SPONGE) IMPLANT
GLOVE BIO SURGEON STRL SZ7.5 (GLOVE) ×4 IMPLANT
GOWN STRL REUS W/ TWL LRG LVL3 (GOWN DISPOSABLE) ×4 IMPLANT
GOWN STRL REUS W/TWL LRG LVL3 (GOWN DISPOSABLE) ×4
KIT MARKER MARGIN INK (KITS) ×2 IMPLANT
NDL HYPO 25X1 1.5 SAFETY (NEEDLE) IMPLANT
NEEDLE HYPO 25X1 1.5 SAFETY (NEEDLE) IMPLANT
NS IRRIG 1000ML POUR BTL (IV SOLUTION) ×2 IMPLANT
PACK BASIN DAY SURGERY FS (CUSTOM PROCEDURE TRAY) ×2 IMPLANT
PENCIL SMOKE EVACUATOR (MISCELLANEOUS) ×2 IMPLANT
SLEEVE SCD COMPRESS KNEE MED (STOCKING) ×2 IMPLANT
SPIKE FLUID TRANSFER (MISCELLANEOUS) IMPLANT
SPONGE T-LAP 18X18 ~~LOC~~+RFID (SPONGE) ×2 IMPLANT
SUT CHROMIC 3 0 SH 27 (SUTURE) IMPLANT
SUT ETHILON 3 0 PS 1 (SUTURE) IMPLANT
SUT MON AB 4-0 PC3 18 (SUTURE) ×2 IMPLANT
SUT PROLENE 3 0 PS 2 (SUTURE) IMPLANT
SUT SILK 2 0 PERMA HAND 18 BK (SUTURE) IMPLANT
SUT SILK 2 0 SH (SUTURE) IMPLANT
SUT VIC AB 3-0 54X BRD REEL (SUTURE) IMPLANT
SUT VIC AB 3-0 BRD 54 (SUTURE)
SUT VIC AB 3-0 FS2 27 (SUTURE) IMPLANT
SUT VIC AB 3-0 SH 27 (SUTURE)
SUT VIC AB 3-0 SH 27X BRD (SUTURE) IMPLANT
SUT VIC AB 4-0 RB1 27 (SUTURE)
SUT VIC AB 4-0 RB1 27X BRD (SUTURE) IMPLANT
SUT VICRYL 3-0 CR8 SH (SUTURE) ×2 IMPLANT
SUT VICRYL 4-0 PS2 18IN ABS (SUTURE) IMPLANT
SUT VICRYL AB 3 0 TIES (SUTURE) IMPLANT
SYR CONTROL 10ML LL (SYRINGE) IMPLANT
TOWEL GREEN STERILE FF (TOWEL DISPOSABLE) ×4 IMPLANT
TRAY FAXITRON CT DISP (TRAY / TRAY PROCEDURE) ×2 IMPLANT
TUBE CONNECTING 20X1/4 (TUBING) ×2 IMPLANT
YANKAUER SUCT BULB TIP NO VENT (SUCTIONS) IMPLANT

## 2022-11-25 NOTE — Transfer of Care (Signed)
Immediate Anesthesia Transfer of Care Note  Patient: Monica Spencer  Procedure(s) Performed: LEFT BREAST LUMPECTOMY WITH RADIOACTIVE SEED LOCALIZATION X2 (Left: Breast) BILATERAL EXCISION CHEST WALL MASS (Bilateral: Chest)  Patient Location: PACU  Anesthesia Type:General  Level of Consciousness: sedated  Airway & Oxygen Therapy: Patient Spontanous Breathing and Patient connected to face mask oxygen  Post-op Assessment: Report given to RN and Post -op Vital signs reviewed and stable  Post vital signs: Reviewed and stable  Last Vitals:  Vitals Value Taken Time  BP 131/74 11/25/22 1107  Temp    Pulse 68 11/25/22 1109  Resp 16 11/25/22 1109  SpO2 98 % 11/25/22 1109  Vitals shown include unvalidated device data.  Last Pain:  Vitals:   11/25/22 0840  TempSrc: Oral  PainSc: 0-No pain      Patients Stated Pain Goal: 8 (11/25/22 0840)  Complications: No notable events documented.

## 2022-11-25 NOTE — Interval H&P Note (Signed)
History and Physical Interval Note:  11/25/2022 9:08 AM  Monica Spencer  has presented today for surgery, with the diagnosis of LEFT BREAST PAPILLOMA X2 AND BILATERAL CHEST WALL MASS.  The various methods of treatment have been discussed with the patient and family. After consideration of risks, benefits and other options for treatment, the patient has consented to  Procedure(s): LEFT BREAST LUMPECTOMY WITH RADIOACTIVE SEED LOCALIZATION X2 (Left) BILATERAL EXCISION CHEST WALL MASS (Bilateral) as a surgical intervention.  The patient's history has been reviewed, patient examined, no change in status, stable for surgery.  I have reviewed the patient's chart and labs.  Questions were answered to the patient's satisfaction.     Monica Spencer

## 2022-11-25 NOTE — Discharge Instructions (Signed)
  Post Anesthesia Home Care Instructions  Activity: Get plenty of rest for the remainder of the day. A responsible individual must stay with you for 24 hours following the procedure.  For the next 24 hours, DO NOT: -Drive a car -Advertising copywriter -Drink alcoholic beverages -Take any medication unless instructed by your physician -Make any legal decisions or sign important papers.  Meals: Start with liquid foods such as gelatin or soup. Progress to regular foods as tolerated. Avoid greasy, spicy, heavy foods. If nausea and/or vomiting occur, drink only clear liquids until the nausea and/or vomiting subsides. Call your physician if vomiting continues.  Special Instructions/Symptoms: Your throat may feel dry or sore from the anesthesia or the breathing tube placed in your throat during surgery. If this causes discomfort, gargle with warm salt water. The discomfort should disappear within 24 hours.     Tylenol can be taken after 2:43 pm if needed Ibuprofen can be taken at 4:43 pm if needed

## 2022-11-25 NOTE — H&P (Signed)
REFERRING PHYSICIAN: Belva Agee PROVIDER: Lindell Noe, MD MRN: [id number] DOB: 06/15/1971 Subjective  Chief Complaint: New Consultation (Left breast papilloma X 2)  History of Present Illness: Monica Spencer is a 52 y.o. female who is seen today as an office consultation for evaluation of New Consultation (Left breast papilloma X 2)  We are asked to see the patient in consultation by Dr. Elon Spanner to evaluate her for papillomas in the left breast. The patient is a 52 year old black female who actually had some papillomas removed from the breast about 4 years ago. At that time she also had a breast reduction. Her only complaint was that at the drain sites she developed what looks like keloids. More recently over the last couple months she did have some clear discharge from the left nipple again. She was evaluated with mammogram and ultrasound and found to have 2 small masses in the left breast. The subareolar one measured 5 mm. The upper outer quadrant 1 measured 1.5 cm. Both were biopsied and came back as intraductal papillomas. Because of the size and the presence of the discharge the recommendation is that both of these areas be removed.  Review of Systems: A complete review of systems was obtained from the patient. I have reviewed this information and discussed as appropriate with the patient. See HPI as well for other ROS.  ROS  Medical History: History reviewed. No pertinent past medical history.  Patient Active Problem List Diagnosis Intraductal papilloma of breast, left  History reviewed. No pertinent surgical history.  No Known Allergies  Current Outpatient Medications on File Prior to Visit Medication Sig Dispense Refill *diphenhydramine hcl oral 1 tab by mouth 2 times a day azithromycin (ZITHROMAX) 250 MG tablet cholecalciferol 1000 unit tablet Take by mouth chorionicgonadotropin alfa (OVIDREL) 250 mcg/0.5 mL prefilled syringe injection cyanocobalamin (VITAMIN B12)  1000 MCG tablet Take 1,000 mcg by mouth once daily doxycycline (VIBRAMYCIN) 100 MG capsule estradiol (ESTRACE) 2 MG tablet 1 tab by mouth 2 times a day 3 estradiol (VIVELLE-DOT) 0.1 mg/24 hr patch ibuprofen (ADVIL,MOTRIN) 600 MG tablet insulin syringe-needle U-100 1/2 mL 29 x 1/2" Syrg labetalol (NORMODYNE) 100 MG tablet labetalol (NORMODYNE) 200 MG tablet levothyroxine (SYNTHROID, LEVOTHROID) 100 MCG tablet 1 tab by mouth daily 1 multivitamin capsule 1 tab by mouth daily(CENTRUM) norethindrone (AYGESTIN) 5 mg tablet 1 tab by mouth daily 1 oxyCODONE 5 mg capsule progesterone (CRINONE) 8 % vaginal cream 1 applicatorful vaginally each morning 3 PROGESTERONE,MICRONIZED (PROGESTERONE, BULK,) 100 % Powd  No current facility-administered medications on file prior to visit.  History reviewed. No pertinent family history.  Social History  Tobacco Use Smoking Status Not on file Smokeless Tobacco Not on file   Social History  Socioeconomic History Marital status: Married  Objective:  Vitals: BP: (!) 142/92 Pulse: 71 Temp: 36.6 C (97.9 F) SpO2: 98% Weight: (!) 114.3 kg (252 lb) Height: 165.1 cm ( )  Body mass index is 41.93 kg/m.  Physical Exam Vitals reviewed. Constitutional: General: She is not in acute distress. Appearance: Normal appearance. HENT: Head: Normocephalic and atraumatic. Right Ear: External ear normal. Left Ear: External ear normal. Nose: Nose normal. Mouth/Throat: Mouth: Mucous membranes are moist. Pharynx: Oropharynx is clear. Eyes: General: No scleral icterus. Extraocular Movements: Extraocular movements intact. Conjunctiva/sclera: Conjunctivae normal. Pupils: Pupils are equal, round, and reactive to light. Cardiovascular: Rate and Rhythm: Normal rate and regular rhythm. Pulses: Normal pulses. Heart sounds: Normal heart sounds. Pulmonary: Effort: Pulmonary effort is normal. No respiratory distress. Breath sounds: Normal breath  sounds. Abdominal: General: Bowel sounds are normal. Palpations: Abdomen is soft. Tenderness: There is no abdominal tenderness. Musculoskeletal: General: No swelling, tenderness or deformity. Normal range of motion. Cervical back: Normal range of motion and neck supple. Skin: General: Skin is warm and dry. Coloration: Skin is not jaundiced. Comments: There are large pedunculated 4 cm masses involving the lateral aspect of both inframammary fold incisions on the chest wall bilaterally. Neurological: General: No focal deficit present. Mental Status: She is alert and oriented to person, place, and time. Psychiatric: Mood and Affect: Mood normal. Behavior: Behavior normal.    Breast: There is no palpable mass in either breast. There is no palpable axillary, supraclavicular, or cervical lymphadenopathy.  Labs, Imaging and Diagnostic Testing:  Assessment and Plan:  Diagnoses and all orders for this visit:  Intraductal papilloma of breast, left   The patient appears to have 2 papillomas in the subareolar and upper outer quadrant left breast with clear nipple discharge. Because of the presence of the discharge and the size of the areas the recommendation is that both of these to be removed. I have discussed with her in detail the risks and benefits of the operation as well as some of the technical aspects including the use of radioactive seeds for localization and she understands and wishes to proceed. She would like to have the chest wall masses that are likely keloids removed at the same time and I think this would be reasonable.

## 2022-11-25 NOTE — Anesthesia Preprocedure Evaluation (Signed)
Anesthesia Evaluation  Patient identified by MRN, date of birth, ID band Patient awake    Reviewed: Allergy & Precautions, H&P , NPO status , Patient's Chart, lab work & pertinent test results  Airway Mallampati: II   Neck ROM: full    Dental   Pulmonary neg pulmonary ROS   breath sounds clear to auscultation       Cardiovascular hypertension,  Rhythm:regular Rate:Normal     Neuro/Psych    GI/Hepatic   Endo/Other  Hypothyroidism  Morbid obesity  Renal/GU      Musculoskeletal   Abdominal   Peds  Hematology   Anesthesia Other Findings   Reproductive/Obstetrics                             Anesthesia Physical Anesthesia Plan  ASA: 2  Anesthesia Plan: General   Post-op Pain Management:    Induction: Intravenous  PONV Risk Score and Plan: 3 and Ondansetron, Dexamethasone, Midazolam and Treatment may vary due to age or medical condition  Airway Management Planned: LMA  Additional Equipment:   Intra-op Plan:   Post-operative Plan: Extubation in OR  Informed Consent: I have reviewed the patients History and Physical, chart, labs and discussed the procedure including the risks, benefits and alternatives for the proposed anesthesia with the patient or authorized representative who has indicated his/her understanding and acceptance.     Dental advisory given  Plan Discussed with: CRNA, Anesthesiologist and Surgeon  Anesthesia Plan Comments:        Anesthesia Quick Evaluation

## 2022-11-25 NOTE — Op Note (Signed)
11/25/2022  11:01 AM  PATIENT:  Monica Spencer  52 y.o. female  PRE-OPERATIVE DIAGNOSIS:  LEFT BREAST PAPILLOMA X2 AND BILATERAL CHEST WALL MASS  POST-OPERATIVE DIAGNOSIS:  LEFT BREAST PAPILLOMA X2 AND BILATERAL CHEST WALL MASS  PROCEDURE:  Procedure(s): LEFT BREAST LUMPECTOMY WITH RADIOACTIVE SEED LOCALIZATION X2 (Left) BILATERAL EXCISION CHEST WALL MASS (Bilateral)  SURGEON:  Surgeon(s) and Role:    Griselda Miner, MD - Primary  PHYSICIAN ASSISTANT:   ASSISTANTS: none   ANESTHESIA:   local and general  EBL:  15 mL   BLOOD ADMINISTERED:none  DRAINS: none   LOCAL MEDICATIONS USED:  MARCAINE     SPECIMEN:  Source of Specimen:  left breast lumpectomy x 2 and bilateral chest wall masses  DISPOSITION OF SPECIMEN:  PATHOLOGY  COUNTS:  YES  TOURNIQUET:  * No tourniquets in log *  DICTATION: .Dragon Dictation  After informed consent was obtained the patient was brought to the operating room and placed in the supine position on the operating table.  After adequate induction of general anesthesia the patient's bilateral chest, breast, and axillary areas were prepped with ChloraPrep, allowed to dry, and draped in usual sterile manner.  An appropriate timeout was performed.  Attention was first turned to the left breast.  Previously 2 I-125 seeds were placed in the left breast to mark areas of intraductal papillomas.  The neoprobe was set to I-125 and the 2 areas were identified.  The first area was upper subareolar.  The area around this was infiltrated with quarter percent Marcaine.  A curvilinear incision was made along the upper edge of the areola of the left breast with a 15 blade knife.  The incision was carried through the skin and subcutaneous tissue sharply with the electrocautery.  Dissection was then carried towards the radioactive seed under the direction of the neoprobe.  Once I more closely approach the radioactive seed I then removed a circular portion of breast  tissue sharply with the electrocautery around the radioactive seed while checking the area of radioactivity frequently.  Once the specimen was removed it was oriented with the appropriate paint colors.  A specimen radiograph was obtained that showed the clip and seed to be within the specimen.  The specimen was sent to pathology as the central left breast papilloma.  Hemostasis was achieved using the Bovie electrocautery.  The wound was irrigated with saline and infiltrated with more quarter percent Marcaine.  The deep layer of the wound was closed with interrupted 3-0 Vicryl stitches.  The skin was closed with interrupted 4-0 Monocryl subcuticular stitches.  Attention was then turned to the second area of the radioactive seed which was far in the upper outer quadrant of the left breast.  The area overlying this was infiltrated with quarter percent Marcaine.  A curvilinear incision was made along the upper outer edge of the left breast near to the area of radioactivity.  The incision was carried through the skin and subcutaneous tissue sharply with the electrocautery.  Dissection was then carried around the radioactive seed while checking the area of radioactivity frequently.  Once the specimen was removed it was oriented with the appropriate paint colors.  A specimen radiograph was obtained that showed the seed but no clip.  I was able to take an additional deep margin and this was also marked appropriately.  A specimen radiograph of this showed the clip.  Both specimens from the upper outer quadrant were then sent to pathology for further evaluation.  Hemostasis was achieved using the Bovie electrocautery.  The wound was irrigated with saline and infiltrated with more quarter percent Marcaine.  The deep layer of the wound was closed with interrupted 3-0 Vicryl stitches.  The skin was then closed with interrupted 4-0 Monocryl subcuticular stitches.  Attention was then turned to the mass of the left chest wall.  The  patient appeared to have a keloid associated with her previous reduction scar in the lower axilla.  The area around this was infiltrated with quarter percent Marcaine.  An elliptical incision was made around the base of the mass with a 15 blade knife.  This incision was carried through the skin and subcutaneous tissue sharply with the electrocautery.  Once the entire mass was removed it was sent to pathology for further evaluation.  Hemostasis was achieved using the Bovie electrocautery.  The incision was then closed with a running 4-0 Monocryl subcuticular stitch.  Attention was then turned to the mass of the right chest wall.  She had a similar keloid along the lateral breast reduction incision in the right axilla.  The area around this was infiltrated with quarter percent Marcaine.  A an elliptical incision was made around the base of the mass with a 15 blade knife.  The incision was carried through the skin and subcutaneous tissue sharply with the electrocautery.  Once the entire mass was removed it was sent to pathology for further evaluation.  Hemostasis was achieved using the Bovie electrocautery.  The wound was irrigated with saline and infiltrated with more quarter percent Marcaine.  The incision was then closed with a running 4-0 Monocryl subcuticular stitch.  Dermabond dressings were then applied to all 4 incisions.  The patient tolerated the procedure well.  At the end of the case all needle sponge and instrument counts were correct.  The patient was then awakened and taken to recovery in stable condition.  PLAN OF CARE: Discharge to home after PACU  PATIENT DISPOSITION:  PACU - hemodynamically stable.   Delay start of Pharmacological VTE agent (>24hrs) due to surgical blood loss or risk of bleeding: not applicable

## 2022-11-25 NOTE — Anesthesia Procedure Notes (Signed)
Procedure Name: LMA Insertion Date/Time: 11/25/2022 9:28 AM  Performed by: Burna Cash, CRNAPre-anesthesia Checklist: Patient identified, Emergency Drugs available, Suction available and Patient being monitored Patient Re-evaluated:Patient Re-evaluated prior to induction Oxygen Delivery Method: Circle system utilized Preoxygenation: Pre-oxygenation with 100% oxygen Induction Type: IV induction Ventilation: Mask ventilation without difficulty LMA: LMA inserted LMA Size: 4.0 Number of attempts: 1 Airway Equipment and Method: Bite block Placement Confirmation: positive ETCO2 Tube secured with: Tape Dental Injury: Teeth and Oropharynx as per pre-operative assessment

## 2022-11-28 ENCOUNTER — Encounter (HOSPITAL_BASED_OUTPATIENT_CLINIC_OR_DEPARTMENT_OTHER): Payer: Self-pay | Admitting: General Surgery

## 2022-11-28 LAB — SURGICAL PATHOLOGY

## 2022-11-29 NOTE — Anesthesia Postprocedure Evaluation (Signed)
Anesthesia Post Note  Patient: ALAYSHA JEFCOAT  Procedure(s) Performed: LEFT BREAST LUMPECTOMY WITH RADIOACTIVE SEED LOCALIZATION X2 (Left: Breast) BILATERAL EXCISION CHEST WALL MASS (Bilateral: Chest)     Patient location during evaluation: PACU Anesthesia Type: General Level of consciousness: awake and alert Pain management: pain level controlled Vital Signs Assessment: post-procedure vital signs reviewed and stable Respiratory status: spontaneous breathing, nonlabored ventilation, respiratory function stable and patient connected to nasal cannula oxygen Cardiovascular status: blood pressure returned to baseline and stable Postop Assessment: no apparent nausea or vomiting Anesthetic complications: no   No notable events documented.  Last Vitals:  Vitals:   11/25/22 1130 11/25/22 1135  BP:  139/83  Pulse: 71   Resp: 15   Temp:    SpO2: 96%     Last Pain:  Vitals:   11/25/22 1135  TempSrc:   PainSc: 0-No pain                 Antoninette Lerner S

## 2023-02-01 ENCOUNTER — Other Ambulatory Visit: Payer: Self-pay | Admitting: Family Medicine

## 2023-02-01 DIAGNOSIS — E890 Postprocedural hypothyroidism: Secondary | ICD-10-CM

## 2023-04-26 DIAGNOSIS — Z1231 Encounter for screening mammogram for malignant neoplasm of breast: Secondary | ICD-10-CM | POA: Diagnosis not present

## 2023-04-26 DIAGNOSIS — Z6841 Body Mass Index (BMI) 40.0 and over, adult: Secondary | ICD-10-CM | POA: Diagnosis not present

## 2023-04-26 DIAGNOSIS — Z01419 Encounter for gynecological examination (general) (routine) without abnormal findings: Secondary | ICD-10-CM | POA: Diagnosis not present

## 2023-07-12 DIAGNOSIS — H35372 Puckering of macula, left eye: Secondary | ICD-10-CM | POA: Diagnosis not present

## 2023-08-15 ENCOUNTER — Other Ambulatory Visit: Payer: Self-pay | Admitting: Family Medicine

## 2023-08-15 DIAGNOSIS — I10 Essential (primary) hypertension: Secondary | ICD-10-CM

## 2023-10-17 DIAGNOSIS — D249 Benign neoplasm of unspecified breast: Secondary | ICD-10-CM | POA: Diagnosis not present

## 2023-10-17 DIAGNOSIS — Z809 Family history of malignant neoplasm, unspecified: Secondary | ICD-10-CM | POA: Diagnosis not present

## 2023-10-18 ENCOUNTER — Encounter: Payer: Self-pay | Admitting: Obstetrics and Gynecology

## 2023-10-18 ENCOUNTER — Other Ambulatory Visit: Payer: Self-pay | Admitting: Obstetrics and Gynecology

## 2023-10-18 DIAGNOSIS — N6452 Nipple discharge: Secondary | ICD-10-CM

## 2023-10-31 ENCOUNTER — Emergency Department (HOSPITAL_BASED_OUTPATIENT_CLINIC_OR_DEPARTMENT_OTHER)
Admission: EM | Admit: 2023-10-31 | Discharge: 2023-10-31 | Disposition: A | Discharge status: Home / Self Care | Attending: Emergency Medicine | Admitting: Emergency Medicine

## 2023-10-31 ENCOUNTER — Encounter (HOSPITAL_BASED_OUTPATIENT_CLINIC_OR_DEPARTMENT_OTHER): Payer: Self-pay

## 2023-10-31 ENCOUNTER — Other Ambulatory Visit: Payer: Self-pay

## 2023-10-31 DIAGNOSIS — J101 Influenza due to other identified influenza virus with other respiratory manifestations: Secondary | ICD-10-CM | POA: Insufficient documentation

## 2023-10-31 DIAGNOSIS — R509 Fever, unspecified: Secondary | ICD-10-CM | POA: Diagnosis not present

## 2023-10-31 DIAGNOSIS — J111 Influenza due to unidentified influenza virus with other respiratory manifestations: Secondary | ICD-10-CM

## 2023-10-31 LAB — RESP PANEL BY RT-PCR (RSV, FLU A&B, COVID)  RVPGX2
Influenza A by PCR: POSITIVE — AB
Influenza B by PCR: NEGATIVE
Resp Syncytial Virus by PCR: NEGATIVE
SARS Coronavirus 2 by RT PCR: NEGATIVE

## 2023-10-31 MED ORDER — KETOROLAC TROMETHAMINE 15 MG/ML IJ SOLN
30.0000 mg | Freq: Once | INTRAMUSCULAR | Status: AC
  Administered 2023-10-31: 30 mg via INTRAMUSCULAR
  Filled 2023-10-31: qty 2

## 2023-10-31 MED ORDER — CELECOXIB 200 MG PO CAPS: 200.0000 mg | ORAL_CAPSULE | Freq: Two times a day (BID) | ORAL | 0 refills | Status: AC

## 2023-10-31 MED ORDER — ACETAMINOPHEN 325 MG PO TABS
650.0000 mg | ORAL_TABLET | Freq: Once | ORAL | Status: AC | PRN
  Administered 2023-10-31: 650 mg via ORAL
  Filled 2023-10-31: qty 2

## 2023-10-31 NOTE — ED Triage Notes (Signed)
 Patient arrives POV with complaints of headache, bilateral eye pain, and cough x2 days.  Rates her pain an 8/10.

## 2023-10-31 NOTE — ED Provider Notes (Signed)
  EMERGENCY DEPARTMENT AT [facility] Provider Note   CSN: [id number] Arrival date & time: 10/31/23  1310     History Chief Complaint  Patient presents with   Headache   Eye Pain    HPI Monica Spencer is a 53 y.o. female presenting for fever and cough and congestion as well as headache. Has headaches occasionally but they typically go away with ASA. No improvement over the past 3 days. Gradual in onset.   Patient's recorded medical, surgical, social, medication list and allergies were reviewed in the Snapshot window as part of the initial history.   Review of Systems   Review of Systems  Constitutional:  Negative for chills and fever.  HENT:  Positive for congestion and sore throat. Negative for ear pain.   Eyes:  Negative for pain and visual disturbance.  Respiratory:  Positive for cough. Negative for choking and shortness of breath.   Cardiovascular:  Negative for chest pain and palpitations.  Gastrointestinal:  Negative for abdominal pain and vomiting.  Genitourinary:  Negative for dysuria and hematuria.  Musculoskeletal:  Negative for arthralgias and back pain.  Skin:  Negative for color change and rash.  Neurological:  Positive for headaches. Negative for seizures and syncope.  All other systems reviewed and are negative.   Physical Exam Updated Vital Signs BP 128/70 (BP Location: Left Arm)   Pulse 92   Temp 100.3 F (37.9 C) (Oral)   Resp 18   Ht 5\' 5"  (1.651 m)   Wt 117.9 kg   LMP 11/19/2019 (Exact Date)   SpO2 95%   BMI 43.27 kg/m  Physical Exam Vitals and nursing note reviewed.  Constitutional:      General: She is not in acute distress.    Appearance: She is well-developed.  HENT:     Head: Normocephalic and atraumatic.  Eyes:     Conjunctiva/sclera: Conjunctivae normal.  Neck:     Meningeal: Brudzinski's sign and Kernig's sign absent.  Cardiovascular:     Rate and Rhythm: Normal rate and regular rhythm.     Heart  sounds: No murmur heard. Pulmonary:     Effort: Pulmonary effort is normal. No respiratory distress.     Breath sounds: Normal breath sounds.  Abdominal:     General: There is no distension.     Palpations: Abdomen is soft.     Tenderness: There is no abdominal tenderness. There is no right CVA tenderness or left CVA tenderness.  Musculoskeletal:        General: No swelling or tenderness. Normal range of motion.     Cervical back: Normal range of motion and neck supple. No rigidity.  Lymphadenopathy:     Cervical: No cervical adenopathy.  Skin:    General: Skin is warm and dry.  Neurological:     General: No focal deficit present.     Mental Status: She is alert and oriented to person, place, and time. Mental status is at baseline.     Cranial Nerves: No cranial nerve deficit.      ED Course/ Medical Decision Making/ A&P    Procedures Procedures   Medications Ordered in ED Medications  acetaminophen (TYLENOL) tablet 650 mg (650 mg Oral Given 10/31/23 1320)  ketorolac (TORADOL) 15 MG/ML injection 30 mg (30 mg Intramuscular Given 10/31/23 1350)   Medical Decision Making:   CARALINE DEUTSCHMAN is a 53 y.o. female who presented to the ED today with subjective fever, cough, congestion detailed above.  Complete initial physical exam performed, notably the patient  was hemodynamically stable in no acute distress.  Posterior oropharynx illuminated and without obvious swelling or deformity.  Patient is without neck stiffness.    Reviewed and confirmed nursing documentation for past medical history, family history, social history.    Initial Assessment:   With the patient's presentation of fever cough congestion, most likely diagnosis is developing viral upper respiratory infection. Other diagnoses were considered including (but not limited to) peritonsillar abscess, retropharyngeal abscess, pneumonia. These are considered less likely due to history of present illness and physical exam  findings.   This is most consistent with an acute complicated illness Considered meningitis, however patient's symptoms, vital signs, physical exam findings including lack of meningismus seem grossly less consistent at this time. Initial Plan:  Viral screening including COVID/flu testing to evaluate for common viral etiologies that need to be tracked Will augment with dose of ketorolac in ED  Objective evaluation as below reviewed         Final Assessment and Plan:   On reassessment, patient is ambulatory tolerating p.o. intake in no acute distress.   Patient's COVID test is negative. + Flu Patient is currently stable for outpatient care and management with no indication for hospitalization or transfer at this time.  Discussed all findings with patient expressed understanding.  Disposition:  Based on the above findings, I believe patient is stable for discharge.    Patient/family educated about specific return precautions for given chief complaint and symptoms.  Patient/family educated about follow-up with PCP.     Patient/family expressed understanding of return precautions and need for follow-up. Patient spoken to regarding all imaging and laboratory results and appropriate follow up for these results. All education provided in verbal form with additional information in written form. Time was allowed for answering of patient questions. Patient discharged.    Emergency Department Medication Summary:   Medications  acetaminophen (TYLENOL) tablet 650 mg (650 mg Oral Given 10/31/23 1320)  ketorolac (TORADOL) 15 MG/ML injection 30 mg (30 mg Intramuscular Given 10/31/23 1350)          Clinical Impression:  1. Flu      Discharge   Final Clinical Impression(s) / ED Diagnoses Final diagnoses:  Flu    Rx / DC Orders ED Discharge Orders          Ordered    celecoxib (CELEBREX) 200 MG capsule  2 times daily        10/31/23 1535              Glyn Ade,  MD 10/31/23 2130

## 2023-11-01 ENCOUNTER — Other Ambulatory Visit

## 2023-11-01 ENCOUNTER — Encounter

## 2023-11-11 ENCOUNTER — Other Ambulatory Visit: Payer: Self-pay | Admitting: Family Medicine

## 2023-11-11 DIAGNOSIS — I10 Essential (primary) hypertension: Secondary | ICD-10-CM

## 2023-11-13 ENCOUNTER — Other Ambulatory Visit: Payer: Self-pay | Admitting: Family Medicine

## 2023-11-13 DIAGNOSIS — I10 Essential (primary) hypertension: Secondary | ICD-10-CM

## 2023-11-13 DIAGNOSIS — E890 Postprocedural hypothyroidism: Secondary | ICD-10-CM

## 2023-11-13 NOTE — Telephone Encounter (Signed)
 Copied from CRM [id number]. Topic: Clinical - Medication Refill >> Nov 13, 2023  2:22 PM Sim Boast F wrote: Most Recent Primary Care Visit:  Provider: Deeann Saint  Department: [facility]  Visit Type: OFFICE VISIT  Date: 07/11/2022  Medication: atenolol levothyroxine   Has the patient contacted their pharmacy? Yes (Agent: If no, request that the patient contact the pharmacy for the refill. If patient does not wish to contact the pharmacy document the reason why and proceed with request.) (Agent: If yes, when and what did the pharmacy advise?)  Is this the correct pharmacy for this prescription? Yes If no, delete pharmacy and type the correct one.  This is the patient's preferred pharmacy:   [id number] [place], [place] - [address] [place] [address] Phone: [phone] Fax: [phone]   Has the prescription been filled recently? Yes  Is the patient out of the medication? No, ALMOST OUT  Has the patient been seen for an appointment in the last year OR does the patient have an upcoming appointment? Yes  Can we respond through MyChart? Yes  Agent: Please be advised that Rx refills may take up to 3 business days. We ask that you follow-up with your pharmacy.

## 2023-11-16 ENCOUNTER — Ambulatory Visit
Admit: 2023-11-16 | Discharge: 2023-11-16 | Disposition: A | Discharge status: Home / Self Care | Attending: Obstetrics and Gynecology | Admitting: Obstetrics and Gynecology

## 2023-11-16 ENCOUNTER — Other Ambulatory Visit: Payer: Self-pay | Admitting: Obstetrics and Gynecology

## 2023-11-16 ENCOUNTER — Ambulatory Visit
Admit: 2023-11-16 | Discharge: 2023-11-16 | Disposition: A | Discharge status: Home / Self Care | Attending: Obstetrics and Gynecology

## 2023-11-16 DIAGNOSIS — N6452 Nipple discharge: Secondary | ICD-10-CM

## 2023-11-16 DIAGNOSIS — N6314 Unspecified lump in the right breast, lower inner quadrant: Secondary | ICD-10-CM | POA: Diagnosis not present

## 2023-11-16 DIAGNOSIS — N6313 Unspecified lump in the right breast, lower outer quadrant: Secondary | ICD-10-CM | POA: Diagnosis not present

## 2023-11-17 ENCOUNTER — Other Ambulatory Visit: Payer: Self-pay | Admitting: Obstetrics and Gynecology

## 2023-11-17 DIAGNOSIS — N6310 Unspecified lump in the right breast, unspecified quadrant: Secondary | ICD-10-CM

## 2023-11-17 DIAGNOSIS — N6452 Nipple discharge: Secondary | ICD-10-CM

## 2023-11-20 ENCOUNTER — Encounter: Payer: Self-pay | Admitting: Family Medicine

## 2023-11-20 ENCOUNTER — Ambulatory Visit (INDEPENDENT_AMBULATORY_CARE_PROVIDER_SITE_OTHER): Admitting: Family Medicine

## 2023-11-20 VITALS — BP 142/74 | HR 74 | Temp 98.1°F | Ht 65.0 in | Wt 255.4 lb

## 2023-11-20 DIAGNOSIS — E782 Mixed hyperlipidemia: Secondary | ICD-10-CM

## 2023-11-20 DIAGNOSIS — I10 Essential (primary) hypertension: Secondary | ICD-10-CM | POA: Diagnosis not present

## 2023-11-20 DIAGNOSIS — Z0000 Encounter for general adult medical examination without abnormal findings: Secondary | ICD-10-CM

## 2023-11-20 DIAGNOSIS — E890 Postprocedural hypothyroidism: Secondary | ICD-10-CM | POA: Diagnosis not present

## 2023-11-20 DIAGNOSIS — R928 Other abnormal and inconclusive findings on diagnostic imaging of breast: Secondary | ICD-10-CM

## 2023-11-20 LAB — COMPREHENSIVE METABOLIC PANEL WITH GFR
ALT: 19 U/L (ref 0–35)
AST: 21 U/L (ref 0–37)
Albumin: 4.3 g/dL (ref 3.5–5.2)
Alkaline Phosphatase: 56 U/L (ref 39–117)
BUN: 11 mg/dL (ref 6–23)
CO2: 27 meq/L (ref 19–32)
Calcium: 9.5 mg/dL (ref 8.4–10.5)
Chloride: 103 meq/L (ref 96–112)
Creatinine, Ser: 1.14 mg/dL (ref 0.40–1.20)
GFR: 54.31 mL/min — ABNORMAL LOW (ref >60.00)
Glucose, Bld: 92 mg/dL (ref 70–99)
Potassium: 3.9 meq/L (ref 3.5–5.1)
Sodium: 139 meq/L (ref 135–145)
Total Bilirubin: 0.4 mg/dL (ref 0.2–1.2)
Total Protein: 8.1 g/dL (ref 6.0–8.3)

## 2023-11-20 LAB — CBC WITH DIFFERENTIAL/PLATELET
Basophils Absolute: 0 10*3/uL (ref 0.0–0.1)
Basophils Relative: 0.4 % (ref 0.0–3.0)
Eosinophils Absolute: 0.1 10*3/uL (ref 0.0–0.7)
Eosinophils Relative: 1.1 % (ref 0.0–5.0)
HCT: 35.4 % — ABNORMAL LOW (ref 36.0–46.0)
Hemoglobin: 11.9 g/dL — ABNORMAL LOW (ref 12.0–15.0)
Lymphocytes Relative: 39.9 % (ref 12.0–46.0)
Lymphs Abs: 1.8 10*3/uL (ref 0.7–4.0)
MCHC: 33.5 g/dL (ref 30.0–36.0)
MCV: 87.4 fl (ref 78.0–100.0)
Monocytes Absolute: 0.4 10*3/uL (ref 0.1–1.0)
Monocytes Relative: 8.2 % (ref 3.0–12.0)
Neutro Abs: 2.2 10*3/uL (ref 1.4–7.7)
Neutrophils Relative %: 50.4 % (ref 43.0–77.0)
Platelets: 406 10*3/uL — ABNORMAL HIGH (ref 150.0–400.0)
RBC: 4.05 Mil/uL (ref 3.87–5.11)
RDW: 13.8 % (ref 11.5–15.5)
WBC: 4.4 10*3/uL (ref 4.0–10.5)

## 2023-11-20 LAB — LIPID PANEL
Cholesterol: 217 mg/dL — ABNORMAL HIGH (ref 0–200)
HDL: 39.4 mg/dL (ref >39.00)
LDL Cholesterol: 145 mg/dL — ABNORMAL HIGH (ref 0–99)
NonHDL: 177.39
Total CHOL/HDL Ratio: 6
Triglycerides: 163 mg/dL — ABNORMAL HIGH (ref 0.0–149.0)
VLDL: 32.6 mg/dL (ref 0.0–40.0)

## 2023-11-20 LAB — TSH: TSH: 2.4 u[IU]/mL (ref 0.35–5.50)

## 2023-11-20 LAB — HEMOGLOBIN A1C: Hgb A1c MFr Bld: 6.4 % (ref 4.6–6.5)

## 2023-11-20 MED ORDER — LEVOTHYROXINE SODIUM 100 MCG PO TABS: 100.0000 ug | ORAL_TABLET | Freq: Every day | ORAL | 3 refills | Status: DC

## 2023-11-20 MED ORDER — ATENOLOL 25 MG PO TABS
25.0000 mg | ORAL_TABLET | Freq: Every day | ORAL | 0 refills | Status: AC
Start: 2023-11-20 — End: ?

## 2023-11-20 MED ORDER — ATENOLOL 25 MG PO TABS: 25.0000 mg | ORAL_TABLET | Freq: Every day | ORAL | 3 refills | Status: DC

## 2023-11-20 MED ORDER — LEVOTHYROXINE SODIUM 100 MCG PO TABS
100.0000 ug | ORAL_TABLET | Freq: Every day | ORAL | 0 refills | Status: DC
Start: 2023-11-20 — End: 2023-11-20

## 2023-11-20 NOTE — Progress Notes (Signed)
 Established Patient Office Visit   Subjective  Patient ID: Monica Spencer, female    DOB: 03-Oct-1970  Age: 53 y.o. MRN: [id number]  Chief Complaint  Patient presents with   Annual Exam    Pt is a 53 yo female seen for CPE.  Pt doing well.  Moving to [place], [place] in June.  Her husband is already there and she just got a job.  Pt states she has been out of synthroid 100 mcg x 1 wk.  Requesting refills.  BP at home and other appointments 130s/70s.  Pap with Ob/Gyn.  Dealing with breast issues.  Having some allergy issues.  OTC allegra and zyrtec in the past have made symptoms worse.  Uses netti pot.   Patient Active Problem List   Diagnosis Date Noted   Essential hypertension 03/26/2021   Menopause present 03/26/2021   S/P bilateral breast reduction 09/06/2019   Neck pain 03/19/2019   Back pain 03/19/2019   Symptomatic mammary hypertrophy 03/19/2019   Postoperative hypothyroidism 03/06/2019   Ductal papilloma, R breast 03/06/2019   Mixed hyperlipidemia 03/06/2019   History of prediabetes 03/06/2019   Hx of adenomatous polyp of colon 01/28/2016   Past Medical History:  Diagnosis Date   Anemia 2004   uterine bleeding needed transfusion   Blood transfusion without reported diagnosis 2004   uterine fibroid bleeding   Hx of adenomatous polyp of colon 01/28/2016   Hypertension    Thyroid disease    Past Surgical History:  Procedure Laterality Date   BREAST BIOPSY Left 08/11/2022   [place] LT BREAST BX W LOC DEV 1ST LESION IMG BX SPEC US GUIDE 08/11/2022 [facility]   BREAST BIOPSY Left 08/11/2022   [place] LT BREAST BX W LOC DEV EA ADD LESION IMG BX SPEC US GUIDE 08/11/2022 [facility]   BREAST BIOPSY  11/24/2022   MM LT RADIOACTIVE SEED LOC MAMMO GUIDE 11/24/2022 [facility]   BREAST BIOPSY  11/24/2022   MM LT RADIOACTIVE SEED EA ADD LESION LOC MAMMO GUIDE 11/24/2022 [facility]   BREAST LUMPECTOMY WITH RADIOACTIVE SEED LOCALIZATION Right 05/01/2019    Procedure: RIGHT BREAST LUMPECTOMY WITH RADIOACTIVE SEED LOCALIZATION X 2;  Surgeon: Griselda Miner, MD;  Location: [facility];  Service: General;  Laterality: Right;   BREAST LUMPECTOMY WITH RADIOACTIVE SEED LOCALIZATION Left 11/25/2022   Procedure: LEFT BREAST LUMPECTOMY WITH RADIOACTIVE SEED LOCALIZATION X2;  Surgeon: Griselda Miner, MD;  Location: [facility];  Service: General;  Laterality: Left;   BREAST REDUCTION SURGERY Bilateral 05/01/2019   Procedure: BILATERAL MAMMARY REDUCTION  (BREAST);  Surgeon: Peggye Form, DO;  Location: [facility];  Service: Plastics;  Laterality: Bilateral;   ENDOMETRIAL ABLATION W/ NOVASURE     KNEE ARTHROSCOPY Left    MASS EXCISION Bilateral 11/25/2022   Procedure: BILATERAL EXCISION CHEST WALL MASS;  Surgeon: Griselda Miner, MD;  Location: [facility];  Service: General;  Laterality: Bilateral;   MYOMECTOMY     THYROIDECTOMY     Social History   Tobacco Use   Smoking status: Never   Smokeless tobacco: Never  Vaping Use   Vaping status: Never Used  Substance Use Topics   Alcohol use: Not Currently    Alcohol/week: 1.0 standard drink of alcohol    Comment: once a year or so   Drug use: Never   Family History  Problem Relation Age of Onset   Colon polyps Mother    Colon cancer  Mother    Cancer Mother    Diabetes Mother    Hypertension Mother    Colon polyps Father    Colon cancer Father    Cancer Father    Colon polyps Sister    Colon polyps Brother    Colon cancer Brother    Breast cancer Maternal Grandmother 67   Cancer Brother    Esophageal cancer Neg Hx    Rectal cancer Neg Hx    Stomach cancer Neg Hx    No Known Allergies   ROS Negative unless stated above    Objective:     BP (!) 142/74 (BP Location: Left Arm, Patient Position: Sitting, Cuff Size: Normal)   Pulse 74   Temp 98.1 F (36.7 C) (Oral)   Ht 5\' 5"  (1.651 m)   Wt 255 lb 6.4 oz (115.8 kg)   LMP  11/19/2019 (Exact Date)   SpO2 96%   BMI 42.50 kg/m  BP Readings from Last 3 Encounters:  11/20/23 (!) 142/74  10/31/23 128/70  11/25/22 139/83   Wt Readings from Last 3 Encounters:  11/20/23 255 lb 6.4 oz (115.8 kg)  10/31/23 260 lb (117.9 kg)  11/25/22 249 lb 12.5 oz (113.3 kg)      Physical Exam Constitutional:      Appearance: Normal appearance.  HENT:     Head: Normocephalic and atraumatic.     Right Ear: Tympanic membrane, ear canal and external ear normal.     Left Ear: Tympanic membrane, ear canal and external ear normal.     Nose: Nose normal.     Mouth/Throat:     Mouth: Mucous membranes are moist.     Pharynx: No oropharyngeal exudate or posterior oropharyngeal erythema.  Eyes:     General: No scleral icterus.    Extraocular Movements: Extraocular movements intact.     Conjunctiva/sclera: Conjunctivae normal.     Pupils: Pupils are equal, round, and reactive to light.  Neck:     Thyroid: No thyromegaly.  Cardiovascular:     Rate and Rhythm: Normal rate and regular rhythm.     Pulses: Normal pulses.     Heart sounds: Normal heart sounds. No murmur heard.    No friction rub.  Pulmonary:     Effort: Pulmonary effort is normal.     Breath sounds: Normal breath sounds. No wheezing, rhonchi or rales.  Abdominal:     General: Bowel sounds are normal.     Palpations: Abdomen is soft.     Tenderness: There is no abdominal tenderness.  Musculoskeletal:        General: No deformity. Normal range of motion.  Lymphadenopathy:     Cervical: No cervical adenopathy.  Skin:    General: Skin is warm and dry.     Findings: No lesion.  Neurological:     General: No focal deficit present.     Mental Status: She is alert and oriented to person, place, and time.  Psychiatric:        Mood and Affect: Mood normal.        Thought Content: Thought content normal.       11/20/2023    8:15 AM 07/11/2022    8:10 AM 03/26/2021    8:14 AM  Depression screen PHQ 2/9   Decreased Interest 0 0 0  Down, Depressed, Hopeless 0 0 0  PHQ - 2 Score 0 0 0  Altered sleeping 0 0 0  Tired, decreased energy 0 0 0  Change in  appetite 0 0 0  Feeling bad or failure about yourself  0 0 0  Trouble concentrating 0 0 0  Moving slowly or fidgety/restless 0 0 0  Suicidal thoughts 0 0 0  PHQ-9 Score 0 0 0      11/20/2023    8:15 AM 03/26/2021    8:14 AM 01/20/2021    8:33 AM  GAD 7 : Generalized Anxiety Score  Nervous, Anxious, on Edge 0 0 0  Control/stop worrying 0 0 0  Worry too much - different things 0 0 0  Trouble relaxing 0 0 0  Restless 0 0 0  Easily annoyed or irritable 0 0 0  Afraid - awful might happen 0 0 0  Total GAD 7 Score 0 0 0  Anxiety Difficulty Not difficult at all      No results found for any visits on 11/20/23.    Assessment & Plan:  Well adult exam -     CBC with Differential/Platelet; Future -     Comprehensive metabolic panel with GFR; Future -     Hemoglobin A1c; Future -     Lipid panel; Future  Essential hypertension -     Comprehensive metabolic panel with GFR; Future -     Atenolol; Take 1 tablet (25 mg total) by mouth daily.  Dispense: 30 tablet; Refill: 0 -     Atenolol; Take 1 tablet (25 mg total) by mouth daily.  Dispense: 90 tablet; Refill: 3  Mixed hyperlipidemia -     Lipid panel; Future  Postsurgical hypothyroidism -     TSH; Future -     Levothyroxine Sodium; Take 1 tablet (100 mcg total) by mouth daily before breakfast.  Dispense: 90 tablet; Refill: 3  Abnormal mammogram  Age-appropriate health screenings discussed.  Will obtain labs.  Refills on atenolol and Synthroid 100 mcg provided.  Recheck BP.  Continue monitoring BP at home.  Mammogram and Pap up-to-date.  Left breast lumpectomy with radioactive seed localization x 2 planned for papillomas due to nipple discharge.  Return if symptoms worsen or fail to improve.   Deeann Saint, MD

## 2023-11-21 ENCOUNTER — Ambulatory Visit
Admission: RE | Admit: 2023-11-21 | Discharge: 2023-11-21 | Disposition: A | Discharge status: Home / Self Care | Source: Ambulatory Visit | Attending: Obstetrics and Gynecology | Admitting: Obstetrics and Gynecology

## 2023-11-21 DIAGNOSIS — N6313 Unspecified lump in the right breast, lower outer quadrant: Secondary | ICD-10-CM | POA: Diagnosis not present

## 2023-11-21 DIAGNOSIS — N6314 Unspecified lump in the right breast, lower inner quadrant: Secondary | ICD-10-CM | POA: Diagnosis not present

## 2023-11-21 DIAGNOSIS — N6341 Unspecified lump in right breast, subareolar: Secondary | ICD-10-CM | POA: Diagnosis not present

## 2023-11-21 DIAGNOSIS — N6452 Nipple discharge: Secondary | ICD-10-CM

## 2023-11-21 DIAGNOSIS — R92321 Mammographic fibroglandular density, right breast: Secondary | ICD-10-CM | POA: Diagnosis not present

## 2023-11-21 DIAGNOSIS — D241 Benign neoplasm of right breast: Secondary | ICD-10-CM | POA: Diagnosis not present

## 2023-11-21 DIAGNOSIS — N6310 Unspecified lump in the right breast, unspecified quadrant: Secondary | ICD-10-CM

## 2023-11-21 HISTORY — PX: BREAST BIOPSY: SHX20

## 2023-11-22 LAB — SURGICAL PATHOLOGY

## 2023-12-01 ENCOUNTER — Encounter: Payer: Self-pay | Admitting: Family Medicine

## 2023-12-04 ENCOUNTER — Other Ambulatory Visit: Payer: Self-pay

## 2023-12-04 NOTE — Telephone Encounter (Signed)
 Called [facility] and they state they will call and see about getting the prescriptions transferred for the patient.  Copied from CRM [id number]. Topic: Clinical - Medication Refill >> Dec 04, 2023  3:02 PM Keitha Pata L wrote: Most Recent Primary Care Visit:  Provider: Cinda Craze R  Department: [facility]  Visit Type: PHYSICAL  Date: 11/20/2023  Medication: atenolol  (TENORMIN ) 25 MG tablet levothyroxine  (SYNTHROID ) 100 MCG tablet   Has the patient contacted their pharmacy? Yes (Agent: If no, request that the patient contact the pharmacy for the refill. If patient does not wish to contact the pharmacy document the reason why and proceed with request.) (Agent: If yes, when and what did the pharmacy advise?)  Is this the correct pharmacy for this prescription? Yes If no, delete pharmacy and type the correct one.  This is the patient's preferred pharmacy:  [facility] - [place], [place] - [address] [[id number]]   Has the prescription been filled recently? No  Is the patient out of the medication? Yes  Has the patient been seen for an appointment in the last year OR does the patient have an upcoming appointment? No  Can we respond through MyChart? Yes  Agent: Please be advised that Rx refills may take up to 3 business days. We ask that you follow-up with your pharmacy.

## 2023-12-19 ENCOUNTER — Other Ambulatory Visit: Payer: Self-pay | Admitting: Family Medicine

## 2023-12-19 DIAGNOSIS — E890 Postprocedural hypothyroidism: Secondary | ICD-10-CM

## 2023-12-20 ENCOUNTER — Ambulatory Visit: Payer: Self-pay | Admitting: General Surgery

## 2023-12-20 DIAGNOSIS — D241 Benign neoplasm of right breast: Secondary | ICD-10-CM

## 2023-12-21 ENCOUNTER — Other Ambulatory Visit: Payer: Self-pay | Admitting: General Surgery

## 2023-12-21 ENCOUNTER — Other Ambulatory Visit: Payer: Self-pay | Admitting: Family Medicine

## 2023-12-21 DIAGNOSIS — E890 Postprocedural hypothyroidism: Secondary | ICD-10-CM

## 2023-12-21 DIAGNOSIS — I10 Essential (primary) hypertension: Secondary | ICD-10-CM

## 2023-12-21 DIAGNOSIS — D241 Benign neoplasm of right breast: Secondary | ICD-10-CM

## 2023-12-21 NOTE — Telephone Encounter (Signed)
 Copied from CRM [id number]. Topic: Clinical - Medication Refill >> Dec 21, 2023  4:04 PM Juleen Oakland F wrote: Medication:  levothyroxine   atenolol    Has the patient contacted their pharmacy? Yes (Agent: If no, request that the patient contact the pharmacy for the refill. If patient does not wish to contact the pharmacy document the reason why and proceed with request.) (Agent: If yes, when and what did the pharmacy advise?)  This is the patient's preferred pharmacy:   [facility] - [place], [place] - [address] [facility] [address] [place] [address] [place] [address] Phone: [phone] Fax: [phone]   Is this the correct pharmacy for this prescription? Yes If no, delete pharmacy and type the correct one.   Has the prescription been filled recently? Yes  Is the patient out of the medication? Yes  Has the patient been seen for an appointment in the last year OR does the patient have an upcoming appointment? Yes  Can we respond through MyChart? No  Agent: Please be advised that Rx refills may take up to 3 business days. We ask that you follow-up with your pharmacy.

## 2023-12-24 ENCOUNTER — Ambulatory Visit
Admission: RE | Admit: 2023-12-24 | Discharge: 2023-12-24 | Discharge status: Home / Self Care | Source: Ambulatory Visit | Attending: General Surgery | Admitting: General Surgery

## 2023-12-24 DIAGNOSIS — Z803 Family history of malignant neoplasm of breast: Secondary | ICD-10-CM | POA: Diagnosis not present

## 2023-12-24 DIAGNOSIS — D241 Benign neoplasm of right breast: Secondary | ICD-10-CM

## 2023-12-24 DIAGNOSIS — R928 Other abnormal and inconclusive findings on diagnostic imaging of breast: Secondary | ICD-10-CM | POA: Diagnosis not present

## 2023-12-24 MED ORDER — GADOPICLENOL 0.5 MMOL/ML IV SOLN
10.0000 mL | Freq: Once | INTRAVENOUS | Status: AC | PRN
  Administered 2023-12-24: 10 mL via INTRAVENOUS

## 2023-12-28 ENCOUNTER — Other Ambulatory Visit: Payer: Self-pay | Admitting: General Surgery

## 2023-12-28 DIAGNOSIS — D241 Benign neoplasm of right breast: Secondary | ICD-10-CM

## 2024-01-02 ENCOUNTER — Other Ambulatory Visit: Payer: Self-pay | Admitting: General Surgery

## 2024-01-02 DIAGNOSIS — R9389 Abnormal findings on diagnostic imaging of other specified body structures: Secondary | ICD-10-CM

## 2024-01-02 DIAGNOSIS — D241 Benign neoplasm of right breast: Secondary | ICD-10-CM | POA: Diagnosis not present

## 2024-01-04 ENCOUNTER — Telehealth: Payer: Self-pay | Admitting: *Deleted

## 2024-01-04 DIAGNOSIS — E890 Postprocedural hypothyroidism: Secondary | ICD-10-CM

## 2024-01-04 DIAGNOSIS — I10 Essential (primary) hypertension: Secondary | ICD-10-CM

## 2024-01-04 MED ORDER — LEVOTHYROXINE SODIUM 100 MCG PO TABS: 100.0000 ug | ORAL_TABLET | Freq: Every day | ORAL | 3 refills | Status: AC

## 2024-01-04 MED ORDER — ATENOLOL 25 MG PO TABS: 25.0000 mg | ORAL_TABLET | Freq: Every day | ORAL | 3 refills | Status: AC

## 2024-01-04 NOTE — Telephone Encounter (Signed)
 Copied from CRM [id number]. Topic: Clinical - Prescription Issue >> Jan 04, 2024 12:24 PM Luane Rumps D wrote: Reason for CRM: Patient calling because her levothyroxine  (SYNTHROID ) 100 MCG tablet and atenolol  (TENORMIN ) 25 MG tablet have not yet been refilled, notified patient that they were refused due to requesting a refill too soon. She states she is supposed to receive a 1 yr supply of each but has only received 1 month supply. She said she is completely out of the atenolol  and has been 1 week off of the med. She wanted to stay that she is moving to [place]  at end of June and has a surgery scheduled next month so she is stressing the urgency of her request. [facility] - [place], [place] - [address] [facility] [address] [place] [address] [place] [address] Phone: [phone] Fax: [phone]

## 2024-01-04 NOTE — Telephone Encounter (Signed)
 Spoke with patient, patient had to change insurance, medication has been sent to [facility]

## 2024-01-15 ENCOUNTER — Ambulatory Visit
Admission: RE | Admit: 2024-01-15 | Discharge: 2024-01-15 | Disposition: A | Discharge status: Home / Self Care | Source: Ambulatory Visit | Attending: General Surgery | Admitting: General Surgery

## 2024-01-15 ENCOUNTER — Encounter (HOSPITAL_BASED_OUTPATIENT_CLINIC_OR_DEPARTMENT_OTHER): Payer: Self-pay | Admitting: General Surgery

## 2024-01-15 ENCOUNTER — Other Ambulatory Visit (HOSPITAL_COMMUNITY): Payer: Self-pay | Admitting: Diagnostic Radiology

## 2024-01-15 ENCOUNTER — Other Ambulatory Visit: Payer: Self-pay

## 2024-01-15 DIAGNOSIS — D241 Benign neoplasm of right breast: Secondary | ICD-10-CM | POA: Diagnosis not present

## 2024-01-15 DIAGNOSIS — R92321 Mammographic fibroglandular density, right breast: Secondary | ICD-10-CM | POA: Diagnosis not present

## 2024-01-15 DIAGNOSIS — N6011 Diffuse cystic mastopathy of right breast: Secondary | ICD-10-CM | POA: Diagnosis not present

## 2024-01-15 DIAGNOSIS — R928 Other abnormal and inconclusive findings on diagnostic imaging of breast: Secondary | ICD-10-CM | POA: Diagnosis not present

## 2024-01-15 DIAGNOSIS — R9389 Abnormal findings on diagnostic imaging of other specified body structures: Secondary | ICD-10-CM

## 2024-01-15 MED ORDER — GADOPICLENOL 0.5 MMOL/ML IV SOLN
10.0000 mL | Freq: Once | INTRAVENOUS | Status: AC | PRN
  Administered 2024-01-15: 10 mL via INTRAVENOUS

## 2024-01-16 ENCOUNTER — Encounter

## 2024-01-16 LAB — SURGICAL PATHOLOGY

## 2024-01-17 ENCOUNTER — Encounter (HOSPITAL_BASED_OUTPATIENT_CLINIC_OR_DEPARTMENT_OTHER)
Admission: RE | Admit: 2024-01-17 | Discharge: 2024-01-17 | Disposition: A | Discharge status: Home / Self Care | Source: Ambulatory Visit | Attending: General Surgery | Admitting: General Surgery

## 2024-01-17 DIAGNOSIS — Z01818 Encounter for other preprocedural examination: Secondary | ICD-10-CM | POA: Diagnosis not present

## 2024-01-17 MED ORDER — CHLORHEXIDINE GLUCONATE CLOTH 2 % EX PADS: 6.0000 | MEDICATED_PAD | Freq: Once | CUTANEOUS | Status: DC

## 2024-01-17 NOTE — Progress Notes (Signed)
      Enhanced Recovery after Surgery  Enhanced Recovery after Surgery is a protocol used to improve the stress on your body and your recovery after surgery.  Patient Instructions  The night before surgery:  No food after midnight. ONLY clear liquids after midnight  The day of surgery (if you do NOT have diabetes):  Drink ONE (1) Pre-Surgery Clear Ensure as directed.   This drink was given to you during your hospital  pre-op appointment visit. The pre-op nurse will instruct you on the time to drink the  Pre-Surgery Ensure depending on your surgery time. Finish the drink at the designated time by the pre-op nurse.  Nothing else to drink after completing the  Pre-Surgery Clear Ensure.  The day of surgery (if you have diabetes): Drink ONE (1) Gatorade 2 (G2) as directed. This drink was given to you during your hospital  pre-op appointment visit.  The pre-op nurse will instruct you on the time to drink the   Gatorade 2 (G2) depending on your surgery time. Color of the Gatorade may vary. Red is not allowed. Nothing else to drink after completing the  Gatorade 2 (G2).         If you have questions, please contact your surgeon's office.  Surgical soap given with instructions

## 2024-01-18 ENCOUNTER — Ambulatory Visit: Payer: Self-pay | Admitting: General Surgery

## 2024-01-18 ENCOUNTER — Other Ambulatory Visit: Payer: Self-pay | Admitting: General Surgery

## 2024-01-18 DIAGNOSIS — N6091 Unspecified benign mammary dysplasia of right breast: Secondary | ICD-10-CM

## 2024-01-18 DIAGNOSIS — D241 Benign neoplasm of right breast: Secondary | ICD-10-CM

## 2024-01-19 ENCOUNTER — Other Ambulatory Visit: Payer: Self-pay

## 2024-01-19 ENCOUNTER — Ambulatory Visit
Admission: RE | Admit: 2024-01-19 | Discharge: 2024-01-19 | Disposition: A | Discharge status: Home / Self Care | Source: Ambulatory Visit | Attending: General Surgery | Admitting: General Surgery

## 2024-01-19 DIAGNOSIS — D241 Benign neoplasm of right breast: Secondary | ICD-10-CM | POA: Diagnosis not present

## 2024-01-19 HISTORY — PX: BREAST BIOPSY: SHX20

## 2024-01-19 NOTE — Progress Notes (Signed)
 PCP - Cinda Craze, MD  EKG - 01/17/24  Anesthesia review: Y  Patient verbally denies any shortness of breath, fever, cough and chest pain during phone call   -------------  SDW INSTRUCTIONS given:  Your procedure is scheduled on Monday, June 9th.  Report to [facility] Main Entrance "A" at 0930 A.M., and check in at the Admitting office.  Call this number if you have problems the morning of surgery:  [phone]   Remember:  Do not eat after midnight the night before your surgery  You may drink clear liquids until 0900 the morning of your surgery.   Clear liquids allowed are: Water, Non-Citrus Juices (without pulp), Carbonated Beverages, Clear Tea, Black Coffee Only, and Gatorade    Take these medicines the morning of surgery with A SIP OF WATER  atenolol  (TENORMIN )  levothyroxine  (SYNTHROID )  oxyCODONE  (ROXICODONE )-if needed  As of today, STOP taking any Aspirin (unless otherwise instructed by your surgeon) Aleve, Naproxen, Ibuprofen, Motrin, Advil, Goody's, BC's, all herbal medications, fish oil, and all vitamins.                      Do not wear jewelry, make up, or nail polish            Do not wear lotions, powders, perfumes/colognes, or deodorant.            Do not shave 48 hours prior to surgery.  Men may shave face and neck.            Do not bring valuables to the hospital.            [facility] is not responsible for any belongings or valuables.  Do NOT Smoke (Tobacco/Vaping) 24 hours prior to your procedure If you use a CPAP at night, you may bring all equipment for your overnight stay.   Contacts, glasses, dentures or bridgework may not be worn into surgery.      For patients admitted to the hospital, discharge time will be determined by your treatment team.   Patients discharged the day of surgery will not be allowed to drive home, and someone needs to stay with them for 24 hours.    Special instructions:   [place]- Preparing For Surgery  Before  surgery, you can play an important role. Because skin is not sterile, your skin needs to be as free of germs as possible. You can reduce the number of germs on your skin by washing with CHG (chlorahexidine gluconate) Soap before surgery.  CHG is an antiseptic cleaner which kills germs and bonds with the skin to continue killing germs even after washing.    Oral Hygiene is also important to reduce your risk of infection.  Remember - BRUSH YOUR TEETH THE MORNING OF SURGERY WITH YOUR REGULAR TOOTHPASTE  Please do not use if you have an allergy to CHG or antibacterial soaps. If your skin becomes reddened/irritated stop using the CHG.  Do not shave (including legs and underarms) for at least 48 hours prior to first CHG shower. It is OK to shave your face.  Please follow these instructions carefully.   Shower the NIGHT BEFORE SURGERY and the MORNING OF SURGERY with DIAL Soap.   Pat yourself dry with a CLEAN TOWEL.  Wear CLEAN PAJAMAS to bed the night before surgery  Place CLEAN SHEETS on your bed the night of your first shower and DO NOT SLEEP WITH PETS.   Day of Surgery: Please shower morning of  surgery  Wear Clean/Comfortable clothing the morning of surgery Do not apply any deodorants/lotions.   Remember to brush your teeth WITH YOUR REGULAR TOOTHPASTE.   Questions were answered. Patient verbalized understanding of instructions.

## 2024-01-19 NOTE — Progress Notes (Signed)
 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  Appears case may have been moved to [facility] in OR from [facility].  History includes never smoker, HTN, thyroidectomy (partial? 1999), hypothyroidism, anemia, uterine fibroids (myomectomy 11/04/09; uterine ablation), breast papillomas (s/p right breast lumpectomy x2 & s/p breast reduction 05/01/19; left breast lumpectomy x2 11/25/22). Last BMI documented is 43, consistent with morbid obesity.   EKG on 01/17/24 showed NSR, minimal voltage criteria for LVH, cannot rule out anterior infarct and felt stable. Last labs were on 11/20/23. She is post-menopausal. Additional labs as indicated on the day of surgery.    VS: Ht 5\' 5"  (1.651 m)   Wt 117.9 kg   LMP 11/19/2019 (Exact Date)   BMI 43.27 kg/m  BP Readings from Last 3 Encounters:  11/20/23 (!) 142/74  10/31/23 128/70  11/25/22 139/83   Pulse Readings from Last 3 Encounters:  11/20/23 74  10/31/23 92  11/25/22 71     PROVIDERS: Viola Greulich, MD is PCP    LABS: Most recent lab results in [facility] include: Lab Results  Component Value Date   WBC 4.4 11/20/2023   HGB 11.9 (L) 11/20/2023   HCT 35.4 (L) 11/20/2023   PLT 406.0 (H) 11/20/2023   GLUCOSE 92 11/20/2023   CHOL 217 (H) 11/20/2023   TRIG 163.0 (H) 11/20/2023   HDL 39.40 11/20/2023   LDLCALC 145 (H) 11/20/2023   ALT 19 11/20/2023   AST 21 11/20/2023   NA 139 11/20/2023   K 3.9 11/20/2023   CL 103 11/20/2023   CREATININE 1.14 11/20/2023   BUN 11 11/20/2023   CO2 27 11/20/2023    TSH 2.40 11/20/2023   HGBA1C 6.4 11/20/2023     EKG: 01/17/24: Normal sinus rhythm Minimal voltage criteria for LVH, may be normal variant ( R in aVL ) Cannot rule out Anterior infarct , age undetermined Abnormal ECG When compared with ECG of 23-Nov-2022 08:03, No significant change since last tracing Confirmed by Alyssa Backbone ([id number]) on 01/17/2024 4:41:45 PM   CV: N/A  Past Medical History:  Diagnosis Date   Anemia 2004   uterine bleeding needed transfusion   Blood transfusion without reported diagnosis 2004   uterine fibroid bleeding   Hx of adenomatous polyp of colon 01/28/2016   Hypertension    Thyroid  disease     Past Surgical History:  Procedure Laterality Date   BREAST BIOPSY Left 08/11/2022   US  LT BREAST BX W LOC DEV 1ST LESION IMG BX SPEC US  GUIDE 08/11/2022 [facility]   BREAST BIOPSY Left 08/11/2022   US  LT BREAST BX W LOC DEV EA ADD LESION IMG BX SPEC US  GUIDE 08/11/2022 [facility]   BREAST BIOPSY  11/24/2022   MM LT RADIOACTIVE SEED LOC MAMMO GUIDE 11/24/2022 [facility]   BREAST BIOPSY  11/24/2022   MM LT RADIOACTIVE SEED EA ADD LESION LOC MAMMO GUIDE 11/24/2022 [facility]   BREAST BIOPSY Right 11/21/2023   US  RT BREAST BX W LOC DEV 1ST LESION IMG BX SPEC US  GUIDE 11/21/2023  [facility]   BREAST LUMPECTOMY WITH RADIOACTIVE SEED LOCALIZATION Right 05/01/2019   Procedure: RIGHT BREAST LUMPECTOMY WITH RADIOACTIVE SEED LOCALIZATION X 2;  Surgeon: Caralyn Chandler, MD;  Location: [facility];  Service: General;  Laterality: Right;   BREAST LUMPECTOMY WITH RADIOACTIVE SEED LOCALIZATION Left 11/25/2022   Procedure: LEFT BREAST LUMPECTOMY WITH RADIOACTIVE SEED LOCALIZATION X2;  Surgeon: Caralyn Chandler, MD;  Location: [facility];  Service: General;  Laterality: Left;   BREAST REDUCTION SURGERY Bilateral 05/01/2019   Procedure: BILATERAL MAMMARY REDUCTION  (BREAST);  Surgeon: Thornell Flirt, DO;  Location: [facility];  Service: Plastics;  Laterality: Bilateral;   ENDOMETRIAL ABLATION W/ NOVASURE     KNEE ARTHROSCOPY Left    MASS EXCISION Bilateral 11/25/2022   Procedure: BILATERAL EXCISION CHEST WALL MASS;  Surgeon: Caralyn Chandler, MD;  Location: [facility];  Service: General;  Laterality: Bilateral;   MYOMECTOMY     THYROIDECTOMY      MEDICATIONS:  Chlorhexidine  Gluconate Cloth 2 % PADS 6 each   And   Chlorhexidine  Gluconate Cloth 2 % PADS 6 each    atenolol  (TENORMIN ) 25 MG tablet   cholecalciferol (VITAMIN D3) 25 MCG (1000 UNIT) tablet   levothyroxine  (SYNTHROID ) 100 MCG tablet   OVER THE COUNTER MEDICATION   atenolol  (TENORMIN ) 25 MG tablet   celecoxib  (CELEBREX ) 200 MG capsule   oxyCODONE  (ROXICODONE ) 5 MG immediate release tablet    Ella Gun, PA-C Surgical Short Stay/Anesthesiology [facility] Phone [phone] [facility] Phone [phone] 01/19/2024 12:39 PM

## 2024-01-22 ENCOUNTER — Other Ambulatory Visit: Payer: Self-pay

## 2024-01-22 ENCOUNTER — Ambulatory Visit (HOSPITAL_COMMUNITY): Payer: Self-pay | Admitting: Vascular Surgery

## 2024-01-22 ENCOUNTER — Encounter

## 2024-01-22 ENCOUNTER — Ambulatory Visit (HOSPITAL_COMMUNITY)
Admission: RE | Admit: 2024-01-22 | Discharge: 2024-01-22 | Disposition: A | Discharge status: Home / Self Care | Attending: General Surgery | Admitting: General Surgery

## 2024-01-22 ENCOUNTER — Ambulatory Visit
Admission: RE | Admit: 2024-01-22 | Discharge: 2024-01-22 | Disposition: A | Discharge status: Home / Self Care | Source: Ambulatory Visit | Attending: General Surgery | Admitting: General Surgery

## 2024-01-22 ENCOUNTER — Encounter (HOSPITAL_COMMUNITY)
Admission: RE | Disposition: A | Discharge status: Home / Self Care | Payer: Self-pay | Source: Home / Self Care | Attending: General Surgery

## 2024-01-22 DIAGNOSIS — D241 Benign neoplasm of right breast: Secondary | ICD-10-CM | POA: Diagnosis not present

## 2024-01-22 DIAGNOSIS — E66813 Obesity, class 3: Secondary | ICD-10-CM | POA: Diagnosis not present

## 2024-01-22 DIAGNOSIS — N6091 Unspecified benign mammary dysplasia of right breast: Secondary | ICD-10-CM | POA: Diagnosis not present

## 2024-01-22 DIAGNOSIS — I10 Essential (primary) hypertension: Secondary | ICD-10-CM | POA: Insufficient documentation

## 2024-01-22 DIAGNOSIS — Z01818 Encounter for other preprocedural examination: Secondary | ICD-10-CM

## 2024-01-22 DIAGNOSIS — Z79899 Other long term (current) drug therapy: Secondary | ICD-10-CM | POA: Insufficient documentation

## 2024-01-22 DIAGNOSIS — E782 Mixed hyperlipidemia: Secondary | ICD-10-CM | POA: Diagnosis not present

## 2024-01-22 DIAGNOSIS — E039 Hypothyroidism, unspecified: Secondary | ICD-10-CM | POA: Insufficient documentation

## 2024-01-22 DIAGNOSIS — Z6841 Body Mass Index (BMI) 40.0 and over, adult: Secondary | ICD-10-CM | POA: Diagnosis not present

## 2024-01-22 HISTORY — PX: BREAST LUMPECTOMY WITH RADIOACTIVE SEED LOCALIZATION: SHX6424

## 2024-01-22 LAB — POCT I-STAT, CHEM 8
BUN: 19 mg/dL (ref 6–20)
Calcium, Ion: 1.1 mmol/L — ABNORMAL LOW (ref 1.15–1.40)
Chloride: 105 mmol/L (ref 98–111)
Creatinine, Ser: 1.1 mg/dL — ABNORMAL HIGH (ref 0.44–1.00)
Glucose, Bld: 105 mg/dL — ABNORMAL HIGH (ref 70–99)
HCT: 33 % — ABNORMAL LOW (ref 36.0–46.0)
Hemoglobin: 11.2 g/dL — ABNORMAL LOW (ref 12.0–15.0)
Potassium: 3.8 mmol/L (ref 3.5–5.1)
Sodium: 140 mmol/L (ref 135–145)
TCO2: 24 mmol/L (ref 22–32)

## 2024-01-22 SURGERY — BREAST LUMPECTOMY WITH RADIOACTIVE SEED LOCALIZATION
Anesthesia: General | Site: Breast | Laterality: Right

## 2024-01-22 MED ORDER — PROPOFOL 1000 MG/100ML IV EMUL
INTRAVENOUS | Status: AC
  Filled 2024-01-22: qty 100

## 2024-01-22 MED ORDER — ONDANSETRON HCL 4 MG/2ML IJ SOLN
INTRAMUSCULAR | Status: DC | PRN
  Administered 2024-01-22: 4 mg via INTRAVENOUS

## 2024-01-22 MED ORDER — ACETAMINOPHEN 10 MG/ML IV SOLN
INTRAVENOUS | Status: AC
  Filled 2024-01-22: qty 100

## 2024-01-22 MED ORDER — BUPIVACAINE-EPINEPHRINE (PF) 0.25% -1:200000 IJ SOLN
INTRAMUSCULAR | Status: AC
Start: 2024-01-22 — End: ?
  Filled 2024-01-22: qty 30

## 2024-01-22 MED ORDER — PHENYLEPHRINE 80 MCG/ML (10ML) SYRINGE FOR IV PUSH (FOR BLOOD PRESSURE SUPPORT)
PREFILLED_SYRINGE | INTRAVENOUS | Status: DC | PRN
  Administered 2024-01-22 (×2): 80 ug via INTRAVENOUS

## 2024-01-22 MED ORDER — 0.9 % SODIUM CHLORIDE (POUR BTL) OPTIME
TOPICAL | Status: DC | PRN
  Administered 2024-01-22: 1000 mL

## 2024-01-22 MED ORDER — ONDANSETRON HCL 4 MG/2ML IJ SOLN
INTRAMUSCULAR | Status: AC
  Filled 2024-01-22: qty 2

## 2024-01-22 MED ORDER — CHLORHEXIDINE GLUCONATE 0.12 % MT SOLN
OROMUCOSAL | Status: AC
  Administered 2024-01-22: 15 mL via OROMUCOSAL
  Filled 2024-01-22: qty 15

## 2024-01-22 MED ORDER — CHLORHEXIDINE GLUCONATE CLOTH 2 % EX PADS: 6.0000 | MEDICATED_PAD | Freq: Once | CUTANEOUS | Status: DC

## 2024-01-22 MED ORDER — LIDOCAINE 2% (20 MG/ML) 5 ML SYRINGE
INTRAMUSCULAR | Status: DC | PRN
  Administered 2024-01-22: 100 mg via INTRAVENOUS

## 2024-01-22 MED ORDER — PROPOFOL 10 MG/ML IV BOLUS
INTRAVENOUS | Status: AC
  Filled 2024-01-22: qty 20

## 2024-01-22 MED ORDER — PROPOFOL 10 MG/ML IV BOLUS
INTRAVENOUS | Status: DC | PRN
  Administered 2024-01-22: 200 mg via INTRAVENOUS

## 2024-01-22 MED ORDER — ACETAMINOPHEN 10 MG/ML IV SOLN
INTRAVENOUS | Status: DC | PRN
  Administered 2024-01-22: 1000 mg via INTRAVENOUS

## 2024-01-22 MED ORDER — LACTATED RINGERS IV SOLN: INTRAVENOUS | Status: DC

## 2024-01-22 MED ORDER — DEXAMETHASONE SODIUM PHOSPHATE 10 MG/ML IJ SOLN
INTRAMUSCULAR | Status: DC | PRN
  Administered 2024-01-22: 10 mg via INTRAVENOUS

## 2024-01-22 MED ORDER — LIDOCAINE 2% (20 MG/ML) 5 ML SYRINGE
INTRAMUSCULAR | Status: AC
  Filled 2024-01-22: qty 5

## 2024-01-22 MED ORDER — CHLORHEXIDINE GLUCONATE 0.12 % MT SOLN: 15.0000 mL | Freq: Once | OROMUCOSAL | Status: AC

## 2024-01-22 MED ORDER — OXYCODONE HCL 5 MG PO TABS: 5.0000 mg | ORAL_TABLET | Freq: Four times a day (QID) | ORAL | 0 refills | Status: AC | PRN

## 2024-01-22 MED ORDER — MIDAZOLAM HCL 2 MG/2ML IJ SOLN
INTRAMUSCULAR | Status: AC
  Filled 2024-01-22: qty 2

## 2024-01-22 MED ORDER — ORAL CARE MOUTH RINSE: 15.0000 mL | Freq: Once | OROMUCOSAL | Status: AC

## 2024-01-22 MED ORDER — DEXMEDETOMIDINE HCL IN NACL 80 MCG/20ML IV SOLN
INTRAVENOUS | Status: DC | PRN
  Administered 2024-01-22: 12 ug via INTRAVENOUS

## 2024-01-22 MED ORDER — PHENYLEPHRINE HCL-NACL 20-0.9 MG/250ML-% IV SOLN
INTRAVENOUS | Status: DC | PRN
Start: 2024-01-22 — End: 2024-01-22
  Administered 2024-01-22: 30 ug/min via INTRAVENOUS

## 2024-01-22 MED ORDER — BUPIVACAINE-EPINEPHRINE (PF) 0.25% -1:200000 IJ SOLN
INTRAMUSCULAR | Status: AC
  Filled 2024-01-22: qty 30

## 2024-01-22 MED ORDER — FENTANYL CITRATE (PF) 250 MCG/5ML IJ SOLN
INTRAMUSCULAR | Status: DC | PRN
  Administered 2024-01-22: 50 ug via INTRAVENOUS

## 2024-01-22 MED ORDER — MIDAZOLAM HCL 2 MG/2ML IJ SOLN
INTRAMUSCULAR | Status: DC | PRN
  Administered 2024-01-22: 2 mg via INTRAVENOUS

## 2024-01-22 MED ORDER — PROPOFOL 500 MG/50ML IV EMUL
INTRAVENOUS | Status: DC | PRN
  Administered 2024-01-22: 150 ug/kg/min via INTRAVENOUS
  Administered 2024-01-22: 125 ug/kg/min via INTRAVENOUS

## 2024-01-22 MED ORDER — CEFAZOLIN SODIUM-DEXTROSE 2-4 GM/100ML-% IV SOLN
2.0000 g | INTRAVENOUS | Status: AC
  Administered 2024-01-22: 2 g via INTRAVENOUS
  Filled 2024-01-22: qty 100

## 2024-01-22 MED ORDER — BUPIVACAINE-EPINEPHRINE 0.25% -1:200000 IJ SOLN
INTRAMUSCULAR | Status: DC | PRN
  Administered 2024-01-22: 25 mL

## 2024-01-22 MED ORDER — PHENYLEPHRINE HCL-NACL 20-0.9 MG/250ML-% IV SOLN
INTRAVENOUS | Status: AC
Start: 2024-01-22 — End: ?
  Filled 2024-01-22: qty 250

## 2024-01-22 MED ORDER — FENTANYL CITRATE (PF) 250 MCG/5ML IJ SOLN
INTRAMUSCULAR | Status: AC
Start: 2024-01-22 — End: ?
  Filled 2024-01-22: qty 5

## 2024-01-22 SURGICAL SUPPLY — 28 items
BAG COUNTER SPONGE SURGICOUNT (BAG) IMPLANT
BINDER BREAST LRG (GAUZE/BANDAGES/DRESSINGS) IMPLANT
BINDER BREAST XLRG (GAUZE/BANDAGES/DRESSINGS) IMPLANT
CANISTER SUCTION 3000ML PPV (SUCTIONS) ×1 IMPLANT
CHLORAPREP W/TINT 26 (MISCELLANEOUS) ×1 IMPLANT
CLIP APPLIE 9.375 MED OPEN (MISCELLANEOUS) IMPLANT
COVER PROBE W GEL 5X96 (DRAPES) ×1 IMPLANT
COVER SURGICAL LIGHT HANDLE (MISCELLANEOUS) ×1 IMPLANT
DERMABOND ADVANCED .7 DNX12 (GAUZE/BANDAGES/DRESSINGS) ×1 IMPLANT
DEVICE DUBIN SPECIMEN MAMMOGRA (MISCELLANEOUS) ×1 IMPLANT
DRAPE CHEST BREAST 15X10 FENES (DRAPES) ×1 IMPLANT
ELECT COATED BLADE 2.86 ST (ELECTRODE) ×1 IMPLANT
ELECTRODE REM PT RTRN 9FT ADLT (ELECTROSURGICAL) ×1 IMPLANT
GLOVE BIO SURGEON STRL SZ7.5 (GLOVE) ×2 IMPLANT
GOWN STRL REUS W/ TWL LRG LVL3 (GOWN DISPOSABLE) ×2 IMPLANT
KIT BASIN OR (CUSTOM PROCEDURE TRAY) ×1 IMPLANT
KIT MARKER MARGIN INK (KITS) ×1 IMPLANT
LIGHT WAVEGUIDE WIDE FLAT (MISCELLANEOUS) IMPLANT
NDL HYPO 25GX1X1/2 BEV (NEEDLE) ×1 IMPLANT
NEEDLE HYPO 25GX1X1/2 BEV (NEEDLE) ×1 IMPLANT
NS IRRIG 1000ML POUR BTL (IV SOLUTION) ×1 IMPLANT
PACK GENERAL/GYN (CUSTOM PROCEDURE TRAY) ×1 IMPLANT
SUT MNCRL AB 4-0 PS2 18 (SUTURE) ×1 IMPLANT
SUT SILK 2 0 SH (SUTURE) IMPLANT
SUT VIC AB 3-0 SH 18 (SUTURE) ×1 IMPLANT
SYR CONTROL 10ML LL (SYRINGE) ×1 IMPLANT
TOWEL GREEN STERILE (TOWEL DISPOSABLE) ×1 IMPLANT
TOWEL GREEN STERILE FF (TOWEL DISPOSABLE) ×1 IMPLANT

## 2024-01-22 NOTE — Anesthesia Preprocedure Evaluation (Addendum)
 Anesthesia Evaluation  Patient identified by MRN, date of birth, ID band Patient awake    Reviewed: Allergy & Precautions, H&P , NPO status , Patient's Chart, lab work & pertinent test results, reviewed documented beta blocker date and time   Airway Mallampati: I  TM Distance: >3 FB Neck ROM: full    Dental no notable dental hx. (+) Teeth Intact, Dental Advisory Given   Pulmonary neg pulmonary ROS   Pulmonary exam normal breath sounds clear to auscultation       Cardiovascular Exercise Tolerance: Good hypertension, Pt. on medications and Pt. on home beta blockers (-) angina (-) Past MI  Rhythm:regular Rate:Normal     Neuro/Psych negative neurological ROS  negative psych ROS   GI/Hepatic negative GI ROS, Neg liver ROS,,,  Endo/Other  Hypothyroidism  Class 3 obesityPapilloma right breast  Renal/GU negative Renal ROS  negative genitourinary   Musculoskeletal   Abdominal  (+) + obese  Peds  Hematology  (+) Blood dyscrasia, anemia Lab Results      Component                Value               Date                      WBC                      4.4                 11/20/2023                HGB                      11.9 (L)            11/20/2023                HCT                      35.4 (L)            11/20/2023                MCV                      87.4                11/20/2023                PLT                      406.0 (H)           11/20/2023              Anesthesia Other Findings   Reproductive/Obstetrics negative OB ROS                             Anesthesia Physical Anesthesia Plan  ASA: 3  Anesthesia Plan: General   Post-op Pain Management: Tylenol  PO (pre-op)* and Precedex   Induction: Intravenous  PONV Risk Score and Plan: 3 and Ondansetron , Treatment may vary due to age or medical condition, Dexamethasone , Midazolam , TIVA and Propofol  infusion  Airway Management  Planned: LMA  Additional Equipment:   Intra-op Plan:   Post-operative Plan: Extubation in OR  Informed Consent: I have  reviewed the patients History and Physical, chart, labs and discussed the procedure including the risks, benefits and alternatives for the proposed anesthesia with the patient or authorized representative who has indicated his/her understanding and acceptance.     Dental Advisory Given  Plan Discussed with: CRNA, Anesthesiologist and Surgeon  Anesthesia Plan Comments: (  )        Anesthesia Quick Evaluation

## 2024-01-22 NOTE — Anesthesia Procedure Notes (Addendum)
 Procedure Name: LMA Insertion Date/Time: 01/22/2024 11:15 AM  Performed by: Hershall Lory, CRNAPre-anesthesia Checklist: Patient identified, Emergency Drugs available, Suction available and Patient being monitored Patient Re-evaluated:Patient Re-evaluated prior to induction Oxygen Delivery Method: Circle System Utilized Preoxygenation: Pre-oxygenation with 100% oxygen Induction Type: IV induction Ventilation: Mask ventilation without difficulty LMA: LMA inserted LMA Size: 5.0 Number of attempts: 1 Airway Equipment and Method: Bite block Placement Confirmation: positive ETCO2 Tube secured with: Tape Dental Injury: Teeth and Oropharynx as per pre-operative assessment

## 2024-01-22 NOTE — Anesthesia Procedure Notes (Deleted)
 Procedure Name: LMA Insertion Date/Time: 01/22/2024 11:15 AM  Performed by: Hershall Lory, CRNAPre-anesthesia Checklist: Patient identified, Emergency Drugs available, Suction available and Patient being monitored Patient Re-evaluated:Patient Re-evaluated prior to induction Oxygen Delivery Method: Circle System Utilized Preoxygenation: Pre-oxygenation with 100% oxygen Induction Type: IV induction Ventilation: Mask ventilation without difficulty LMA: LMA inserted LMA Size: 5.0 Number of attempts: 1 Airway Equipment and Method: Bite block Placement Confirmation: positive ETCO2 Tube secured with: Tape Dental Injury: Teeth and Oropharynx as per pre-operative assessment

## 2024-01-22 NOTE — Transfer of Care (Signed)
 Immediate Anesthesia Transfer of Care Note  Patient: Monica Spencer  Procedure(s) Performed: BREAST LUMPECTOMY WITH RADIOACTIVE SEED LOCALIZATION x2 (Right: Breast)  Patient Location: PACU  Anesthesia Type:General  Level of Consciousness: awake and alert   Airway & Oxygen Therapy: Patient Spontanous Breathing  Post-op Assessment: Report given to RN  Post vital signs: Reviewed and stable  Last Vitals:  Vitals Value Taken Time  BP 117/61 01/22/24 1224  Temp 36.8 C 01/22/24 1224  Pulse 71 01/22/24 1226  Resp 25 01/22/24 1226  SpO2 98 % 01/22/24 1226  Vitals shown include unfiled device data.  Last Pain:  Vitals:   01/22/24 1224  TempSrc:   PainSc: Asleep         Complications: No notable events documented.

## 2024-01-22 NOTE — Anesthesia Postprocedure Evaluation (Signed)
 Anesthesia Post Note  Patient: Monica Spencer  Procedure(s) Performed: BREAST LUMPECTOMY WITH RADIOACTIVE SEED LOCALIZATION x2 (Right: Breast)     Patient location during evaluation: PACU Anesthesia Type: General Level of consciousness: awake and alert Pain management: pain level controlled Vital Signs Assessment: post-procedure vital signs reviewed and stable Respiratory status: spontaneous breathing, nonlabored ventilation, respiratory function stable and patient connected to nasal cannula oxygen Cardiovascular status: blood pressure returned to baseline and stable Postop Assessment: no apparent nausea or vomiting Anesthetic complications: no   No notable events documented.  Last Vitals:  Vitals:   01/22/24 1245 01/22/24 1300  BP: 125/81 119/71  Pulse: 67 66  Resp: 17 17  Temp: 36.8 C   SpO2: 99% 100%    Last Pain:  Vitals:   01/22/24 1245  TempSrc:   PainSc: Asleep                 Leslye Rast

## 2024-01-22 NOTE — Op Note (Addendum)
 01/22/2024  12:10 PM  PATIENT:  Monica Spencer  53 y.o. female  PRE-OPERATIVE DIAGNOSIS:  RIGHT BREAST PAPILLOMA and ADH  POST-OPERATIVE DIAGNOSIS:  RIGHT BREAST PAPILLOMA and ADH  PROCEDURE:  Procedure(s) with comments: RIGHT BREAST LUMPECTOMY WITH RADIOACTIVE SEED LOCALIZATION X 2   SURGEON:  Surgeons and Role:    * Caralyn Chandler, MD - Primary  PHYSICIAN ASSISTANT:   ASSISTANTS: none   ANESTHESIA:   local and general  EBL:  minimal   BLOOD ADMINISTERED:none  DRAINS: none   LOCAL MEDICATIONS USED:  MARCAINE      SPECIMEN:  Source of Specimen:  right breast tissue subareolar and upper outer quadrant  DISPOSITION OF SPECIMEN:  PATHOLOGY  COUNTS:  YES  TOURNIQUET:  * No tourniquets in log *  DICTATION: .Dragon Dictation  After informed consent was obtained the patient was brought to the operating room and placed in the supine position on the operating table.  After adequate induction of general anesthesia the patient's right breast was prepped with ChloraPrep, allowed to dry, and draped in usual sterile manner.  An appropriate timeout was performed.  Previously 2 I-125 seeds were placed in the subareolar and upper outer quadrant of the right breast to mark an area of papilloma and atypical ductal hyperplasia.  The neoprobe was set to I-125 in the 2 areas were readily identified.  The area around each spot was then infiltrated with quarter percent Marcaine .  I made a curvilinear incision along the upper and outer edge of the areola of the right breast with a 15 blade knife.  The incision was carried through the skin and subcutaneous tissue sharply with the electrocautery.  Attention was first directed towards the subareolar lesion.  The dissection was carried from the periareolar incision underneath the areola and nipple superficially with the electrocautery.  Once I was beyond the area of the radioactive seed I then removed a circular portion of breast tissue sharply with the  electrocautery around the radioactive seed while checking the area of radioactivity frequently.  I could definitely feel the fullness in this area.  Once the tissue was removed it was oriented with the appropriate paint colors.  A specimen radiograph was obtained that showed the clip and seed to be within the specimen.  The specimen was then sent to pathology for further evaluation.  Attention was then turned to the lesion in the upper outer quadrant of the right breast.  From the incision the section was carried into the upper outer quadrant under the direction of the neoprobe.  Once I more closely approach the radioactive seed I then removed a circular portion of breast tissue sharply with the electrocautery around the radioactive seed while checking the area of radioactivity frequently.  Once the tissue was removed it was oriented with the appropriate paint colors.  A specimen radiograph was obtained that showed the seed but not the clip within the specimen.  On reviewing the imaging the clip was between the seed and the hematoma.  During the dissection I encountered the hematoma with the specimen and removed it.  I could also definitely feel the fullness in the specimen.  I elected not to do any further dissection.  Hemostasis was then achieved using the Bovie electrocautery.  The wound was irrigated with saline and infiltrated with more quarter percent Marcaine .  The deep layer of the 2 incisions was then closed with interrupted 3-0 Vicryl stitches.  The skin was then closed with interrupted 4-0 Monocryl subcuticular stitches.  Dermabond dressings were applied.  The patient tolerated the procedure well.  At the end of the case all needle sponge and instrument counts were correct.  The patient was then awakened and taken to recovery in stable condition.  PLAN OF CARE: Discharge to home after PACU  PATIENT DISPOSITION:  PACU - hemodynamically stable.   Delay start of Pharmacological VTE agent (>24hrs) due  to surgical blood loss or risk of bleeding: not applicable

## 2024-01-22 NOTE — Interval H&P Note (Signed)
 History and Physical Interval Note:  01/22/2024 10:50 AM  Monica Spencer  has presented today for surgery, with the diagnosis of RIGHT BREAST PAPILLOMA and ADH.  The various methods of treatment have been discussed with the patient and family. After consideration of risks, benefits and other options for treatment, the patient has consented to  Procedure(s) with comments: BREAST LUMPECTOMY WITH RADIOACTIVE SEED LOCALIZATION (Right) - RIGHT BREAST RADIOACTIVE SEED LOCALIZED LUMPECTOMY x 2 as a surgical intervention.  The patient's history has been reviewed, patient examined, no change in status, stable for surgery.  I have reviewed the patient's chart and labs.  Questions were answered to the patient's satisfaction.     [name]

## 2024-01-22 NOTE — H&P (Signed)
 MRN: [id number] DOB: 08-30-1970 Subjective   Chief Complaint: RE-CHECK   History of Present Illness: Monica Spencer is a 53 y.o. female who is seen today for intraductal papillomas. The patient is a 53 year old black female who is about 1 year status post left breast lumpectomy's for intraductal papillomas. She also had keloids removed from her chest wall on both sides. She tolerated the surgery well. She denies any significant breast pain. Over the last couple months she has been experiencing clear discharge from the right nipple. She underwent mammogram and ultrasound and was found to have a 1.5 cm mass in the subareolar 6 o'clock position of the right breast. This was biopsied and came back as an intraductal papilloma. She underwent MRI which showed a small second area of enhancement in the right breast that they recommended biopsy for. This has not been done yet    Review of Systems: A complete review of systems was obtained from the patient. I have reviewed this information and discussed as appropriate with the patient. See HPI as well for other ROS.  ROS   Medical History: Past Medical History:  Diagnosis Date  Anemia  Arthritis  Thyroid  disease   Patient Active Problem List  Diagnosis  Intraductal papilloma of breast, left  Chest wall mass  Intraductal papilloma of right breast   Past Surgical History:  Procedure Laterality Date  REDUCTION MAMMAPLASTY 04/2019  LAPAROSCOPIC MYOMECTOMY  2004, 2010    No Known Allergies  Current Outpatient Medications on File Prior to Visit  Medication Sig Dispense Refill  cyanocobalamin (VITAMIN B12) 1000 MCG tablet Take 1,000 mcg by mouth once daily  levothyroxine  (SYNTHROID , LEVOTHROID) 100 MCG tablet 1 tab by mouth daily 1  multivitamin capsule 1 tab by mouth daily(CENTRUM)  *diphenhydramine hcl oral 1 tab by mouth 2 times a day  azithromycin (ZITHROMAX) 250 MG tablet  cholecalciferol 1000 unit tablet Take by mouth   chorionicgonadotropin alfa (OVIDREL) 250 mcg/0.5 mL prefilled syringe injection  doxycycline  (VIBRAMYCIN ) 100 MG capsule  estradiol (ESTRACE) 2 MG tablet 1 tab by mouth 2 times a day 3  estradiol (VIVELLE-DOT) 0.1 mg/24 hr patch  ibuprofen (ADVIL,MOTRIN) 600 MG tablet  insulin syringe-needle U-100 1/2 mL 29 x 1/2" Syrg  labetalol (NORMODYNE) 100 MG tablet  labetalol (NORMODYNE) 200 MG tablet  norethindrone (AYGESTIN) 5 mg tablet 1 tab by mouth daily 1  oxyCODONE  5 mg capsule  progesterone (CRINONE) 8 % vaginal cream 1 applicatorful vaginally each morning 3  PROGESTERONE,MICRONIZED (PROGESTERONE, BULK,) 100 % Powd   No current facility-administered medications on file prior to visit.   History reviewed. No pertinent family history.   Social History   Tobacco Use  Smoking Status Never  Smokeless Tobacco Never    Social History   Socioeconomic History  Marital status: Married  Tobacco Use  Smoking status: Never  Smokeless tobacco: Never  Substance and Sexual Activity  Alcohol use: Yes  Drug use: Never   Social Drivers of Corporate investment banker Strain: Low Risk (11/19/2023)  Received from [facility]  Overall Financial Resource Strain (CARDIA)  Difficulty of Paying Living Expenses: Not hard at all  Food Insecurity: No Food Insecurity (11/19/2023)  Received from [facility]  Hunger Vital Sign  Worried About Running Out of Food in the Last Year: Never true  Ran Out of Food in the Last Year: Never true  Transportation Needs: No Transportation Needs (11/19/2023)  Received [facility] - [facility]  Lack of Transportation (Medical): No  Lack of Transportation (Non-Medical): No  Physical Activity: Insufficiently Active (11/19/2023)  Received from [facility]  Exercise Vital Sign  Days of Exercise per Week: 2 days  Minutes of Exercise per Session: 40 min  Stress: No Stress Concern Present (11/19/2023)  Received from [facility]  [facility]upational [facility] - Occupational Stress Questionnaire  Feeling of Stress : Not at all  Social Connections: Unknown (11/19/2023)  Received from [facility]  Social Connection and Isolation Panel [NHANES]  Frequency of Social Gatherings with Friends and Family: Once a week  Attends Religious Services: More than 4 times per year  Active Member of Clubs or Organizations: No  Marital Status: Married  Housing Stability: Unknown (12/20/2023)  Housing Stability Vital Sign  Homeless in the Last Year: No   Objective:   Vitals:  PainSc: 0-No pain    There is no height or weight on file to calculate BMI.  Physical Exam Vitals reviewed.  Constitutional:  General: She is not in acute distress. Appearance: Normal appearance.  HENT:  Head: Normocephalic and atraumatic.  Right Ear: External ear normal.  Left Ear: External ear normal.  Nose: Nose normal.  Mouth/Throat:  Mouth: Mucous membranes are moist.  Pharynx: Oropharynx is clear.  Eyes:  General: No scleral icterus. Extraocular Movements: Extraocular movements intact.  Conjunctiva/sclera: Conjunctivae normal.  Pupils: Pupils are equal, round, and reactive to light.  Cardiovascular:  Rate and Rhythm: Normal rate and regular rhythm.  Pulses: Normal pulses.  Heart sounds: Normal heart sounds.  Pulmonary:  Effort: Pulmonary effort is normal. No respiratory distress.  Breath sounds: Normal breath sounds.  Abdominal:  General: Bowel sounds are normal.  Palpations: Abdomen is soft.  Tenderness: There is no abdominal tenderness.  Musculoskeletal:  General: No swelling, tenderness or deformity. Normal range of motion.  Cervical back: Normal range of motion and neck supple.  Skin: General: Skin is warm and dry.  Coloration: Skin is not jaundiced.  Neurological:  General: No focal deficit present.  Mental Status: She is alert and oriented to person, place, and time.  Psychiatric:  Mood and Affect: Mood normal.  Behavior: Behavior  normal.     Breast: The left breast periareolar and upper outer quadrant incisions as well as the chest wall incisions are all healing nicely with no sign of infection or seroma. The incisions do look like they have a tendency to keloid.  Labs, Imaging and Diagnostic Testing:  Assessment and Plan:   Diagnoses and all orders for this visit:  Intraductal papilloma of right breast - MRI breast biopsy RT w clip and specimen image; Future    The patient is about 1 year status post left breast lumpectomy for benign papillomas as well as excision of keloids from the chest wall on both sides. She tolerated the surgery well. She now has an intraductal papilloma in the subareolar right breast. MRI also identified a second area of enhancement that needs biopsied. If this is benign then we may not have to do anything with it. If it has any worrisome features then we may need to remove it at the time that we remove the papilloma. We will continue to move forward with surgery in early June. I will request the MR biopsy as soon as possible so as not to interfere with surgical scheduling. Second biopsy showed ADH which will also need to be removed.

## 2024-01-23 ENCOUNTER — Encounter (HOSPITAL_COMMUNITY): Payer: Self-pay | Admitting: General Surgery

## 2024-01-23 LAB — SURGICAL PATHOLOGY

## 2024-01-24 ENCOUNTER — Ambulatory Visit: Payer: Self-pay | Admitting: General Surgery
